# Patient Record
Sex: Male | Born: 1965
Health system: Southern US, Community
[De-identification: ages and names within clinical notes are randomized; demographics above are authoritative.]

## PROBLEM LIST (undated history)

## (undated) DIAGNOSIS — E785 Hyperlipidemia, unspecified: Secondary | ICD-10-CM

## (undated) DIAGNOSIS — D649 Anemia, unspecified: Secondary | ICD-10-CM

## (undated) DIAGNOSIS — K5010 Crohn's disease of large intestine without complications: Secondary | ICD-10-CM

## (undated) HISTORY — DX: Hyperlipidemia, unspecified: E78.5

## (undated) HISTORY — PX: COLOSTOMY: SHX63

## (undated) HISTORY — DX: Anemia, unspecified: D64.9

## (undated) HISTORY — PX: ILEOSTOMY: SHX1783

---

## 2005-06-26 ENCOUNTER — Emergency Department (HOSPITAL_COMMUNITY): Admission: EM | Admit: 2005-06-26 | Discharge: 2005-06-26 | Payer: Self-pay | Admitting: Family Medicine

## 2015-06-25 DIAGNOSIS — K5080 Crohn's disease of both small and large intestine without complications: Secondary | ICD-10-CM | POA: Diagnosis not present

## 2015-07-02 DIAGNOSIS — K529 Noninfective gastroenteritis and colitis, unspecified: Secondary | ICD-10-CM | POA: Diagnosis not present

## 2015-07-02 DIAGNOSIS — I10 Essential (primary) hypertension: Secondary | ICD-10-CM | POA: Diagnosis not present

## 2015-07-02 DIAGNOSIS — R5382 Chronic fatigue, unspecified: Secondary | ICD-10-CM | POA: Diagnosis not present

## 2015-07-02 DIAGNOSIS — K5090 Crohn's disease, unspecified, without complications: Secondary | ICD-10-CM | POA: Diagnosis not present

## 2015-07-02 DIAGNOSIS — F17210 Nicotine dependence, cigarettes, uncomplicated: Secondary | ICD-10-CM | POA: Diagnosis not present

## 2015-07-02 DIAGNOSIS — E559 Vitamin D deficiency, unspecified: Secondary | ICD-10-CM | POA: Diagnosis not present

## 2015-07-13 DIAGNOSIS — J111 Influenza due to unidentified influenza virus with other respiratory manifestations: Secondary | ICD-10-CM | POA: Diagnosis not present

## 2015-08-23 DIAGNOSIS — K5080 Crohn's disease of both small and large intestine without complications: Secondary | ICD-10-CM | POA: Diagnosis not present

## 2015-09-02 DIAGNOSIS — K5090 Crohn's disease, unspecified, without complications: Secondary | ICD-10-CM | POA: Diagnosis not present

## 2015-09-02 DIAGNOSIS — K633 Ulcer of intestine: Secondary | ICD-10-CM | POA: Diagnosis not present

## 2015-09-02 DIAGNOSIS — K635 Polyp of colon: Secondary | ICD-10-CM | POA: Diagnosis not present

## 2015-09-02 DIAGNOSIS — K626 Ulcer of anus and rectum: Secondary | ICD-10-CM | POA: Diagnosis not present

## 2015-09-03 DIAGNOSIS — N183 Chronic kidney disease, stage 3 (moderate): Secondary | ICD-10-CM | POA: Diagnosis not present

## 2015-09-03 DIAGNOSIS — D649 Anemia, unspecified: Secondary | ICD-10-CM | POA: Diagnosis not present

## 2015-09-03 DIAGNOSIS — Z23 Encounter for immunization: Secondary | ICD-10-CM | POA: Diagnosis not present

## 2015-09-03 DIAGNOSIS — I129 Hypertensive chronic kidney disease with stage 1 through stage 4 chronic kidney disease, or unspecified chronic kidney disease: Secondary | ICD-10-CM | POA: Diagnosis not present

## 2015-09-03 DIAGNOSIS — Z9114 Patient's other noncompliance with medication regimen: Secondary | ICD-10-CM | POA: Diagnosis not present

## 2015-09-03 DIAGNOSIS — K5090 Crohn's disease, unspecified, without complications: Secondary | ICD-10-CM | POA: Diagnosis not present

## 2015-09-03 DIAGNOSIS — E559 Vitamin D deficiency, unspecified: Secondary | ICD-10-CM | POA: Diagnosis not present

## 2015-09-03 DIAGNOSIS — Z720 Tobacco use: Secondary | ICD-10-CM | POA: Diagnosis not present

## 2015-09-03 DIAGNOSIS — R0789 Other chest pain: Secondary | ICD-10-CM | POA: Diagnosis not present

## 2015-10-25 DIAGNOSIS — K529 Noninfective gastroenteritis and colitis, unspecified: Secondary | ICD-10-CM | POA: Diagnosis not present

## 2015-10-25 DIAGNOSIS — H5210 Myopia, unspecified eye: Secondary | ICD-10-CM | POA: Diagnosis not present

## 2015-10-25 DIAGNOSIS — E559 Vitamin D deficiency, unspecified: Secondary | ICD-10-CM | POA: Diagnosis not present

## 2015-10-25 DIAGNOSIS — K5090 Crohn's disease, unspecified, without complications: Secondary | ICD-10-CM | POA: Diagnosis not present

## 2015-10-25 DIAGNOSIS — Q142 Congenital malformation of optic disc: Secondary | ICD-10-CM | POA: Diagnosis not present

## 2015-10-25 DIAGNOSIS — H524 Presbyopia: Secondary | ICD-10-CM | POA: Diagnosis not present

## 2015-10-25 DIAGNOSIS — R197 Diarrhea, unspecified: Secondary | ICD-10-CM | POA: Diagnosis not present

## 2015-10-25 DIAGNOSIS — H16149 Punctate keratitis, unspecified eye: Secondary | ICD-10-CM | POA: Diagnosis not present

## 2015-10-25 DIAGNOSIS — F17210 Nicotine dependence, cigarettes, uncomplicated: Secondary | ICD-10-CM | POA: Diagnosis not present

## 2015-10-25 DIAGNOSIS — Z23 Encounter for immunization: Secondary | ICD-10-CM | POA: Diagnosis not present

## 2015-10-25 DIAGNOSIS — H5213 Myopia, bilateral: Secondary | ICD-10-CM | POA: Diagnosis not present

## 2015-10-25 DIAGNOSIS — H04129 Dry eye syndrome of unspecified lacrimal gland: Secondary | ICD-10-CM | POA: Diagnosis not present

## 2015-10-25 DIAGNOSIS — K50018 Crohn's disease of small intestine with other complication: Secondary | ICD-10-CM | POA: Diagnosis not present

## 2016-01-03 DIAGNOSIS — K50018 Crohn's disease of small intestine with other complication: Secondary | ICD-10-CM | POA: Diagnosis not present

## 2016-01-03 DIAGNOSIS — K5090 Crohn's disease, unspecified, without complications: Secondary | ICD-10-CM | POA: Diagnosis not present

## 2016-01-11 DIAGNOSIS — K5090 Crohn's disease, unspecified, without complications: Secondary | ICD-10-CM | POA: Diagnosis not present

## 2016-03-21 ENCOUNTER — Encounter (HOSPITAL_COMMUNITY): Payer: Self-pay | Admitting: Emergency Medicine

## 2016-03-21 ENCOUNTER — Encounter: Payer: Self-pay | Admitting: Physician Assistant

## 2016-03-21 ENCOUNTER — Emergency Department (HOSPITAL_COMMUNITY)
Admission: EM | Admit: 2016-03-21 | Discharge: 2016-03-21 | Disposition: A | Discharge status: Home / Self Care | Payer: Self-pay | Attending: Emergency Medicine | Admitting: Emergency Medicine

## 2016-03-21 DIAGNOSIS — L308 Other specified dermatitis: Secondary | ICD-10-CM | POA: Insufficient documentation

## 2016-03-21 DIAGNOSIS — F17210 Nicotine dependence, cigarettes, uncomplicated: Secondary | ICD-10-CM | POA: Insufficient documentation

## 2016-03-21 DIAGNOSIS — L309 Dermatitis, unspecified: Secondary | ICD-10-CM | POA: Diagnosis not present

## 2016-03-21 HISTORY — DX: Crohn's disease of large intestine without complications: K50.10

## 2016-03-21 MED ORDER — CEPHALEXIN 500 MG PO CAPS: 500.0000 mg | ORAL_CAPSULE | Freq: Two times a day (BID) | ORAL | 0 refills | Status: AC

## 2016-03-21 MED ORDER — HYDROXYZINE HCL 10 MG PO TABS: 10.0000 mg | ORAL_TABLET | Freq: Four times a day (QID) | ORAL | 0 refills | Status: DC | PRN

## 2016-03-21 MED ORDER — CEPHALEXIN 250 MG PO CAPS
500.0000 mg | ORAL_CAPSULE | Freq: Once | ORAL | Status: AC
  Administered 2016-03-21: 500 mg via ORAL
  Filled 2016-03-21: qty 2

## 2016-03-21 MED ORDER — TRIAMCINOLONE ACETONIDE 0.1 % EX CREA: 1.0000 "application " | TOPICAL_CREAM | Freq: Two times a day (BID) | CUTANEOUS | 0 refills | Status: DC

## 2016-03-21 NOTE — ED Triage Notes (Signed)
Pt states he is here for rash that started 3 weeks ago and has spread to hands and feet, groins. Pt states it itches and has been oozing. Pt states he needs Remecaid medication refill.

## 2016-03-21 NOTE — ED Notes (Signed)
[facility] COUPON 183 GIVEN

## 2016-03-21 NOTE — ED Provider Notes (Signed)
[place] DEPT Provider Note   CSN: [id number] Arrival date & time: 03/21/16  9678  By signing my name below, I, Sonum Patel, attest that this documentation has been prepared under the direction and in the presence of Harlene Ramus, [place]. Electronically Signed: Sonum Patel, Education administrator. 03/21/16. 12:35 PM.  History   Chief Complaint Chief Complaint  Patient presents with  . Rash    The history is provided by the patient. No language interpreter was used.     HPI Comments: Julian Garner is a 50 y.o. male who presents to the Emergency Department complaining of 1 month of a constant pruritic rash to the tops of bilateral feet, hands, and face that initially was resolving but worsened again 1 week ago. He was using OTC antibiotic cream and hydrocortisone cream initially with relief but it is no longer helping. He reports associated scaly skin and clear drainage. He denies fever, sore throat, cough, SOB, CP, abdominal pain, vomiting. Denies use of any new soaps, lotions, detergents, medications or foods.   He also reports a history of Crohn's disease and receives Remicade infusions every 6-8 weeks. He states his last infusion was 1-2 months ago. He states has recently moved to [place] from [place] and does not have follow up with GI currently. He denies fever, nausea, vomiting, blood in stool, abdominal pain.   Past Medical History:  Diagnosis Date  . Crohn's colitis ([place])     There are no active problems to display for this patient.   Past Surgical History:  Procedure Laterality Date  . ILEOSTOMY         Home Medications    Prior to Admission medications   Medication Sig Start Date End Date Taking? Authorizing Provider  cephALEXin (KEFLEX) 500 MG capsule Take 1 capsule (500 mg total) by mouth 2 (two) times daily. 03/21/16 03/28/16  Nona Dell, PA-C  hydrOXYzine (ATARAX/VISTARIL) 10 MG tablet Take 1 tablet (10 mg total) by mouth every 6 (six) hours as needed  for itching. 03/21/16   Nona Dell, PA-C  triamcinolone cream (KENALOG) 0.1 % Apply 1 application topically 2 (two) times daily. 03/21/16   Nona Dell, PA-C    Family History History reviewed. No pertinent family history.  Social History Social History  Substance Use Topics  . Smoking status: Current Every Day Smoker    Packs/day: 0.50    Types: Cigarettes  . Smokeless tobacco: Never Used  . Alcohol use Yes     Allergies   Review of patient's allergies indicates no known allergies.   Review of Systems Review of Systems  Constitutional: Negative for fever.  Gastrointestinal: Negative for abdominal pain, blood in stool and vomiting.  Skin: Positive for rash.     Physical Exam Updated Vital Signs BP 158/85   Pulse 70   Temp 98.7 F (37.1 C) (Oral)   Resp 16   SpO2 100%   Physical Exam  Constitutional: He is oriented to person, place, and time. He appears well-developed and well-nourished. No distress.  HENT:  Head: Normocephalic and atraumatic.  Mouth/Throat: Uvula is midline, oropharynx is clear and moist and mucous membranes are normal. No oral lesions. No oropharyngeal exudate, posterior oropharyngeal edema, posterior oropharyngeal erythema or tonsillar abscesses.  No oral lesions.   Eyes: Conjunctivae and EOM are normal. Right eye exhibits no discharge. Left eye exhibits no discharge. No scleral icterus.  Neck: Normal range of motion. Neck supple.  Cardiovascular: Normal rate, regular rhythm, normal heart sounds and intact distal  pulses.   Pulmonary/Chest: Effort normal and breath sounds normal. No respiratory distress. He has no wheezes. He has no rales. He exhibits no tenderness.  Abdominal: Soft. Bowel sounds are normal. He exhibits no distension and no mass. There is no tenderness. There is no rebound and no guarding. No hernia.  Musculoskeletal: Normal range of motion. He exhibits no edema.  Neurological: He is alert and oriented to  person, place, and time.  Skin: Skin is warm and dry. He is not diaphoretic.  Diffuse dry skin noted throughout with small areas of erythematous papules and excoriations noted to bilateral hands and forearms. Large area of diffuse dry skin noted to dorsum of bilateral feet, left worse than right. Left foot with few small areas weeping serous fluid which appear to have been scratched. No surrounding swelling, erythema, or warmth. No lesions on palms or soles. No vesicles, pustules, or bulla.   Nursing note and vitals reviewed.    ED Treatments / Results  DIAGNOSTIC STUDIES: Oxygen Saturation is 100% on RA, normal by my interpretation.    COORDINATION OF CARE: 12:41 PM Discussed treatment plan with pt at bedside and pt agreed to plan.    Labs (all labs ordered are listed, but only abnormal results are displayed) Labs Reviewed - No data to display  EKG  EKG Interpretation None       Radiology No results found.  Procedures Procedures (including critical care time)  Medications Ordered in ED Medications  cephALEXin (KEFLEX) capsule 500 mg (500 mg Oral Given 03/21/16 1301)     Initial Impression / Assessment and Plan / ED Course  I have reviewed the triage vital signs and the nursing notes.  Pertinent labs & imaging results that were available during my care of the patient were reviewed by me and considered in my medical decision making (see chart for details).  Clinical Course     Rash consistent with eczema. Patient denies any difficulty breathing or swallowing.  Pt has a patent airway without stridor and is handling secretions without difficulty; no angioedema. No blisters, no pustules, no warmth, no draining sinus tracts, no superficial abscesses, no bullous impetigo, no vesicles, no desquamation, no target lesions with dusky purpura or a central bulla. Not tender to touch. Due to small amount of serous drainage noted to rash on left foot will tx with antibiotics for  possible secondary infection. No concern for SJS, TEN, TSS, tick borne illness, syphilis or other life-threatening condition. Will discharge home with keflex, kenalog ointment and vistaril for itching. Patient given information to call to schedule an appointment with gastroenterology to establish care for further management of his Crohn's. Discussed return precautions with patient. Advised patient to follow up with PCP at his rash has not improved over the next 1-2 weeks.     Final Clinical Impressions(s) / ED Diagnoses   Final diagnoses:  Other eczema    New Prescriptions Discharge Medication List as of 03/21/2016 12:57 PM    START taking these medications   Details  cephALEXin (KEFLEX) 500 MG capsule Take 1 capsule (500 mg total) by mouth 2 (two) times daily., Starting Tue 03/21/2016, Until Tue 03/28/2016, Print    triamcinolone cream (KENALOG) 0.1 % Apply 1 application topically 2 (two) times daily., Starting Tue 03/21/2016, Print       I personally performed the services described in this documentation, which was scribed in my presence. The recorded information has been reviewed and is accurate.    Chesley Noon [place], [place] 03/21/16  [place], MD 03/23/16 [phone]

## 2016-03-21 NOTE — ED Notes (Signed)
Pt states he is relocating to [place]. Has hx of Crohn's disease. States he needs to get set up for his Remacaid treatments. Here to for rash to arms and feet.

## 2016-03-21 NOTE — Discharge Instructions (Signed)
Take your antibiotic as prescribed until completed. I recommend applying the steroid cream 1-2 times daily for the next 2-4 weeks until your rash has improved. He should also use an emollient such as Eucerin which can be applied before after applying the topical steroids. Please follow up with a primary care provider from the Resource Guide provided below in one week if your rash has not improved. I also recommend calling the gastroenterology clinic listed below to set up an appointment to establish care for further management of your Crohn's. Please return to the Emergency Department if symptoms worsen or new onset of fever, abdominal pain, vomiting, blood in emesis or stool, redness, swelling, warmth, drainage, new/worsening rash.

## 2016-03-30 ENCOUNTER — Ambulatory Visit: Payer: Self-pay | Admitting: Physician Assistant

## 2016-04-11 ENCOUNTER — Ambulatory Visit: Payer: Self-pay | Admitting: Physician Assistant

## 2016-04-19 ENCOUNTER — Other Ambulatory Visit (INDEPENDENT_AMBULATORY_CARE_PROVIDER_SITE_OTHER): Payer: Medicare Other

## 2016-04-19 ENCOUNTER — Encounter: Payer: Self-pay | Admitting: Physician Assistant

## 2016-04-19 ENCOUNTER — Ambulatory Visit (INDEPENDENT_AMBULATORY_CARE_PROVIDER_SITE_OTHER): Payer: Medicare Other | Admitting: Physician Assistant

## 2016-04-19 VITALS — BP 132/66 | HR 104 | Ht 72.5 in | Wt 192.0 lb

## 2016-04-19 DIAGNOSIS — K50919 Crohn's disease, unspecified, with unspecified complications: Secondary | ICD-10-CM | POA: Diagnosis not present

## 2016-04-19 DIAGNOSIS — E538 Deficiency of other specified B group vitamins: Secondary | ICD-10-CM

## 2016-04-19 LAB — CBC WITH DIFFERENTIAL/PLATELET
BASOS PCT: 0.2 % (ref 0.0–3.0)
Basophils Absolute: 0 10*3/uL (ref 0.0–0.1)
EOS ABS: 0 10*3/uL (ref 0.0–0.7)
EOS PCT: 0.6 % (ref 0.0–5.0)
HEMATOCRIT: 34.6 % — AB (ref 39.0–52.0)
HEMOGLOBIN: 11.8 g/dL — AB (ref 13.0–17.0)
LYMPHS PCT: 19 % (ref 12.0–46.0)
Lymphs Abs: 1.1 10*3/uL (ref 0.7–4.0)
MCHC: 34 g/dL (ref 30.0–36.0)
MCV: 100.1 fl — ABNORMAL HIGH (ref 78.0–100.0)
Monocytes Absolute: 0.5 10*3/uL (ref 0.1–1.0)
Monocytes Relative: 9.1 % (ref 3.0–12.0)
Neutro Abs: 4.2 10*3/uL (ref 1.4–7.7)
Neutrophils Relative %: 71.1 % (ref 43.0–77.0)
Platelets: 341 10*3/uL (ref 150.0–400.0)
RBC: 3.46 Mil/uL — AB (ref 4.22–5.81)
RDW: 16 % — AB (ref 11.5–15.5)
WBC: 5.9 10*3/uL (ref 4.0–10.5)

## 2016-04-19 LAB — COMPREHENSIVE METABOLIC PANEL
ALBUMIN: 4.1 g/dL (ref 3.5–5.2)
ALK PHOS: 69 U/L (ref 39–117)
ALT: 14 U/L (ref 0–53)
AST: 21 U/L (ref 0–37)
BILIRUBIN TOTAL: 0.4 mg/dL (ref 0.2–1.2)
BUN: 16 mg/dL (ref 6–23)
CALCIUM: 9.5 mg/dL (ref 8.4–10.5)
CHLORIDE: 108 meq/L (ref 96–112)
CO2: 22 mEq/L (ref 19–32)
CREATININE: 1.78 mg/dL — AB (ref 0.40–1.50)
GFR: 52.64 mL/min — ABNORMAL LOW (ref >60.00)
Glucose, Bld: 82 mg/dL (ref 70–99)
Potassium: 4.1 mEq/L (ref 3.5–5.1)
Sodium: 138 mEq/L (ref 135–145)
TOTAL PROTEIN: 8.2 g/dL (ref 6.0–8.3)

## 2016-04-19 LAB — IBC PANEL
Iron: 42 ug/dL (ref 42–165)
SATURATION RATIOS: 11.6 % — AB (ref 20.0–50.0)
Transferrin: 258 mg/dL (ref 212.0–360.0)

## 2016-04-19 LAB — VITAMIN B12: Vitamin B-12: 79 pg/mL — ABNORMAL LOW (ref 211–911)

## 2016-04-19 LAB — FOLATE: Folate: 11.9 ng/mL (ref >5.9)

## 2016-04-19 LAB — C-REACTIVE PROTEIN: CRP: 0.5 mg/dL (ref 0.5–20.0)

## 2016-04-19 LAB — SEDIMENTATION RATE: SED RATE: 80 mm/h — AB (ref 0–15)

## 2016-04-19 NOTE — Patient Instructions (Addendum)
Please go to the basement level to have your labs drawn.   Dr. Corena Pilgrim nurse will contact you with a date for your Remicade infusion and B12 injection.

## 2016-04-19 NOTE — Progress Notes (Addendum)
Subjective:    Patient ID: Julian Garner, male    DOB: 06/30/1965, 50 y.o.   MRN: [id number]  HPI Julian Garner is a 50 year old African-American male, new to GI today referred by the [facility] after ER visit on 03/21/2016 which time he was seen for rash felt consistent with eczema. Agent reports long history of Crohn's disease dating back 15-20 years. He says he initially received GI care in [place], then [place] and has recently relocated to [place]. He has had several surgeries for what he describes as bowel obstructions and had a temporary  ileostomy as well as temporary: Ostomy in the past. He does not believe that he has had fistulous disease and has not had any perirectal disease. He has been maintained on Remicade over the past few years and says he's been receiving infusions every 6-8 weeks. He generally does very well and doesn't have any symptoms until he gets close to the time for his next infusion. He says when he has symptoms he generally gets right greater than left-sided lower abdominal discomfort. He has no current complaints of significant abdominal pain, no diarrhea or rectal bleeding. He is overdue for Remicade infusion, last received Remicade in September 2017. He states he is also B12 deficient and had been receiving a B12 injection with each Remicade infusion. He has  not been on any other therapy for Crohn's over the past 3-4 years. We do not have his records from GI physician in [place], he did provide that information today and records will be obtained.  Review of Systems Pertinent positive and negative review of systems were noted in the above HPI section.  All other review of systems was otherwise negative.  Outpatient Encounter Prescriptions as of 04/19/2016  Medication Sig  . hydrOXYzine (ATARAX/VISTARIL) 10 MG tablet Take 1 tablet (10 mg total) by mouth every 6 (six) hours as needed for itching.  . triamcinolone cream (KENALOG) 0.1 % Apply 1  application topically 2 (two) times daily.   No facility-administered encounter medications on file as of 04/19/2016.    No Known Allergies There are no active problems to display for this patient.  Social History   Social History  . Marital status: Single    Spouse name: N/A  . Number of children: 5  . Years of education: N/A   Occupational History  . disabled    Social History Main Topics  . Smoking status: Current Every Day Smoker    Packs/day: 0.50    Types: Cigarettes  . Smokeless tobacco: Never Used  . Alcohol use 1.8 - 2.4 oz/week    3 - 4 Cans of beer per week     Comment: daily  . Drug use:     Types: Marijuana  . Sexual activity: Not on file   Other Topics Concern  . Not on file   Social History Narrative  . No narrative on file    Mr. Julian Garner family history includes Diabetes in his father; Healthy in his mother; Hypertension in his maternal grandmother.      Objective:    Vitals:   04/19/16 1056  BP: 132/66  Pulse: (!) 104    Physical Exam upped African-American male in no acute distress, blood pressure 132/66 pulse 104, Height 6 foot, weight 192, BMI 25.6. HEENT ;nontraumatic normocephalic EOMI PERRLA sclera anicteric, Cardiovascular; regular rate and rhythm with S1-S2 no murmur or gallop, Pulmonary ;clear bilaterally, Abdomen; soft, is some mild tenderness in the right mid  right lower quadrant is no guarding or rebound no palpable mass or hepatosplenomegaly, he has a midline incisional scar in previous ileostomy and colostomy scars. Rectal ;exam not done, Ext;no clubbing cyanosis or edema skin warm and dry, Neuropsych ;mood and affect appropriate       Assessment & Plan:   #64 50 year old African-American male with long-standing Crohn's disease, establishing GI care here today after relocating from [place]. Patient has been on Remicade over the past few years with good control of his disease by his report. #2 several prior surgery secondary to  Crohn's elated obstruction with previous temporary ileostomy and col ostomy. #3 B12 deficiency  Plan; Obtain records from patient's gastroenterologist in [place] CBC, CMET, sedimentation rate CRP and B12 level. We'll also obtain QuantiFERON Gold Will initiate approval process for Remicade 5 mg/kg every 8 weeks Will also plan to continue B12 injections 1000 milligrams IM with each Remicade infusion. Patient will be established with Dr. Loletha Carrow Once records have been obtained and reviewed can decide indication for follow-up colonoscopy and/or changes to the above regimen.  Addendum;  The records received from [facility]. Records show patient with history of ileocolonic stricturing Crohn's disease, initially diagnosed 2008. He has had multiple small bowel resections and had an ileostomy December 2012 with reversal in 2014. Last colonoscopy done in April 2017 with fair prep showing multiple localized aphthous ulcers in the neoterminal ileum, there were a few superficial stellate ulcers in the rectum, ileocolonic anastomosis appeared normal. path report  notes indicate pathology showed hyperplastic changes in the small bowel mucosa, no dysplasia or malignancy., Rectal mucosa with hyperplastic change biopsies negative transverse colon biopsies negative He also had colonoscopy in March 2016 with active ileitis proctitis and colitis. At that time it was recommended to increase his Remicade to 8 mg/kg. Records also relate he had been on Lialda 4.8 g daily. Records will be scanned.   Danasha Melman S Jakel Alphin PA-C 04/19/2016   Cc: No ref. provider found   Thank you for sending this case to me. I have reviewed the entire note, and the outlined plan seems appropriate.  I will review his records as well. Given the last colonoscopy findings with active disease in the neo-TI, it may be that this dose of Remicade is insufficient, he needs concomitant immune suppressant or different  class of biologic.  05/05/16;   Infliximab antibodies negative and infliximab trough level = 0.6, which is subtherapeutic. However, he was overdue for infusion, lowering trough level Will increase next infusion to 10 mg/kg because persistent symptoms between infusions and active disease seen on last colonoscopy (from records)

## 2016-04-20 ENCOUNTER — Other Ambulatory Visit: Payer: Self-pay

## 2016-04-20 ENCOUNTER — Telehealth: Payer: Self-pay

## 2016-04-20 DIAGNOSIS — K50919 Crohn's disease, unspecified, with unspecified complications: Secondary | ICD-10-CM

## 2016-04-20 MED ORDER — CYANOCOBALAMIN 1000 MCG/ML IJ SOLN: 1000.0000 ug | Freq: Once | INTRAMUSCULAR | Status: DC

## 2016-04-20 MED ORDER — SODIUM CHLORIDE 0.9 % IV SOLN: 5.0000 mg/kg | Freq: Once | INTRAVENOUS | Status: DC

## 2016-04-20 MED ORDER — INFLIXIMAB 100 MG IV SOLR: 5.0000 mg/kg | INTRAVENOUS | 3 refills | Status: DC

## 2016-04-20 MED ORDER — DIPHENHYDRAMINE HCL 25 MG PO TABS: 50.0000 mg | ORAL_TABLET | Freq: Once | ORAL | Status: DC

## 2016-04-20 MED ORDER — ACETAMINOPHEN 325 MG PO TABS: 650.0000 mg | ORAL_TABLET | Freq: Once | ORAL | Status: DC

## 2016-04-20 NOTE — Telephone Encounter (Signed)
-----   Message from Alfredia Ferguson, PA-C sent at 04/19/2016  1:30 PM EST ----- Regarding: remicade Greg[place] Signs- this is a new pt establishing with [place] today- will be Dr Loletha Carrow pt. He has been on Remicade - just moved form [place]- please initiate approval process for continuation of Remicade 5 mg/kg q 8 weeks - and go ahead and schedule infusion as soon as possible as he is overdue,  Also send order for B12 1000 mg Im to be given  when he goes for each infusion

## 2016-04-20 NOTE — Telephone Encounter (Signed)
Left vm for patient to call re: remicade infusion date/time. Patient is scheduled at [facility], 04/26/16 at 9:00 am. [facility] short stay did not have available appointment until 05/10/16.  According to [place] patient do not require prior authorization.

## 2016-04-20 NOTE — Telephone Encounter (Signed)
Spoke to patient, he is aware of Remicade infusion.

## 2016-04-21 ENCOUNTER — Other Ambulatory Visit: Payer: Self-pay

## 2016-04-21 ENCOUNTER — Telehealth: Payer: Self-pay | Admitting: Gastroenterology

## 2016-04-21 DIAGNOSIS — K50919 Crohn's disease, unspecified, with unspecified complications: Secondary | ICD-10-CM

## 2016-04-21 LAB — QUANTIFERON TB GOLD ASSAY (BLOOD)
INTERFERON GAMMA RELEASE ASSAY: NEGATIVE
MITOGEN-NIL SO: 6.41 [IU]/mL
Quantiferon Nil Value: 1.15 IU/mL

## 2016-04-21 MED ORDER — CYANOCOBALAMIN 1000 MCG/ML IJ SOLN: 1000.0000 ug | INTRAMUSCULAR | 0 refills | Status: AC

## 2016-04-21 NOTE — Telephone Encounter (Signed)
Spoke to patient, he will come get infliximab level drawn on 12/5 between 10:00-12:00 at our lab. His infusion is on 12/6 at 9:00. Patient will also get his B-12 injection on that day too. Will schedule patient to get weekly B-12 injections for 3 weeks following then will go to monthly injections.

## 2016-04-21 NOTE — Telephone Encounter (Signed)
I reviewed the patient's outside records.  I have changed my mind on the imfliximab (remicade) dosing for him.  Please keep it at his current dose of 46m/kg for the upcoming infusion, but I need an infliximab level drawn at our office lab the day before or early the AM of his infusion. The level must be drawn within 24 hours BEFORE the infusion.  I will determine the dose of future infusions based partially on that lab result.

## 2016-04-21 NOTE — Progress Notes (Signed)
Letter mailed

## 2016-04-24 ENCOUNTER — Telehealth: Payer: Self-pay | Admitting: *Deleted

## 2016-04-24 NOTE — Telephone Encounter (Signed)
Received records on 04-19-2016.  Stamped all pages with date, MR # and Provider's name on 04-24-2016.  Sent to be scanned.

## 2016-04-25 ENCOUNTER — Telehealth: Payer: Self-pay | Admitting: Physician Assistant

## 2016-04-25 ENCOUNTER — Other Ambulatory Visit: Payer: Self-pay

## 2016-04-25 NOTE — Telephone Encounter (Signed)
Spoke to [place] in short stay at [facility], also faxed order to draw infliximab level prior to giving infusion. Patient had difficulty getting a ride today to come get his labs done, Dr. Loletha Carrow okay'd it to be done tomorrow. Noticed that in epic, order is still there for the remicade 5 mg/kg along with remicade 34m/kg. Cannot delete order. Updated dosage is remicade 825mkg per Dr. DaLoletha Carrow

## 2016-04-25 NOTE — Telephone Encounter (Signed)
Spoke to patient, he is having a difficult time finding transportation to come in for his lab work today. He is to have infliximab level drawn today prior to his infusion tomorrow. If he cannot get this done, do you want me to order this drawn prior to the infusion tomorrow? Please advise.

## 2016-04-25 NOTE — Telephone Encounter (Signed)
patient states that he was told to come in for labs anytime between 10-12 today. He states that his transportation has not arrived as of yet and is not sure what time they will arrive. He is wanting to know if it will be ok to come in later this afternoon or tomorrow morning.

## 2016-04-25 NOTE — Telephone Encounter (Signed)
Yes, it can be done tomorrow, but please communicate with infusion center so they know what to draw and how to send it.  It a specialty lab that they may not be used to drawing.   I want the infliximab level, I do not need the antibody level.

## 2016-04-26 ENCOUNTER — Ambulatory Visit (HOSPITAL_COMMUNITY)
Admission: RE | Admit: 2016-04-26 | Discharge: 2016-04-26 | Disposition: A | Discharge status: Home / Self Care | Payer: Medicare Other | Source: Ambulatory Visit | Attending: Gastroenterology | Admitting: Gastroenterology

## 2016-04-26 DIAGNOSIS — K50919 Crohn's disease, unspecified, with unspecified complications: Secondary | ICD-10-CM | POA: Diagnosis not present

## 2016-04-26 MED ORDER — DIPHENHYDRAMINE HCL 25 MG PO CAPS
ORAL_CAPSULE | ORAL | Status: AC
  Administered 2016-04-26: 50 mg via ORAL
  Filled 2016-04-26: qty 2

## 2016-04-26 MED ORDER — ACETAMINOPHEN 325 MG PO TABS
650.0000 mg | ORAL_TABLET | Freq: Once | ORAL | Status: AC
  Administered 2016-04-26: 650 mg via ORAL

## 2016-04-26 MED ORDER — CYANOCOBALAMIN 1000 MCG/ML IJ SOLN
INTRAMUSCULAR | Status: AC
  Administered 2016-04-26: 1000 ug
  Filled 2016-04-26: qty 1

## 2016-04-26 MED ORDER — CYANOCOBALAMIN 1000 MCG/ML IJ SOLN: 1000.0000 ug | Freq: Once | INTRAMUSCULAR | Status: DC

## 2016-04-26 MED ORDER — DIPHENHYDRAMINE HCL 25 MG PO CAPS
50.0000 mg | ORAL_CAPSULE | Freq: Once | ORAL | Status: AC
  Administered 2016-04-26: 50 mg via ORAL

## 2016-04-26 MED ORDER — ACETAMINOPHEN 325 MG PO TABS
ORAL_TABLET | ORAL | Status: AC
  Administered 2016-04-26: 650 mg via ORAL
  Filled 2016-04-26: qty 2

## 2016-04-26 MED ORDER — SODIUM CHLORIDE 0.9 % IV SOLN
8.0000 mg/kg | Freq: Once | INTRAVENOUS | Status: AC
  Administered 2016-04-26: 700 mg via INTRAVENOUS
  Filled 2016-04-26: qty 70

## 2016-05-04 ENCOUNTER — Encounter: Payer: Medicare Other | Admitting: Gastroenterology

## 2016-05-04 ENCOUNTER — Encounter: Payer: Self-pay | Admitting: Gastroenterology

## 2016-05-04 NOTE — Progress Notes (Signed)
Patient came today for his 2nd b12 of 4 weekly injections. His first injection was given at Short Stay on the same day he had a Remicade infusion. Upon bringing patient into the room, it was found that he did not bring the b12 medication that was sent to his pharmacy as he states he was not told to get any medicine, although there is documentation otherwise (see 04/21/16 documentation). I advised patient that unfortunately, we do not have the medication here and that he would have to get the medication that is already at the pharmacy so we can give injections. He became more agitated and began walking out of the room. I asked if I could reschedule his injections and he told me "no."    I have left his upcoming appointments scheduled for now in the case that he changes his mind.  I will cc Dr Loletha Carrow to advise.

## 2016-05-05 ENCOUNTER — Telehealth: Payer: Self-pay

## 2016-05-05 NOTE — Progress Notes (Signed)
Please call him to tell him the B12 is at his pharmacy and it is time for his injection.  He needs it weekly x 4 and then monthly. If he would prefer to get it at his primary care clinic because they perhaps have it on hand, fine with me.  If he does not want it at all, that is his choice.  He needs to see me in the clinic in January so we can discuss his Crohn's and plan future Remicade infusions.   His trough infliximab level was low, so I will be increasing the dose of future infusions.    I have open office time 1/18 and 1/22.

## 2016-05-05 NOTE — Telephone Encounter (Signed)
Spoke to patient, he did receive the letter that states he needs to go to the pharmacy and pick up his B12 vial and bring it to his injection appointments. I also reminded him that we discussed this over the phone. Gave him Dr. Loletha Carrow' recommendations about either getting the injections here or at his PCP's office. He said that he would give himself the injections. I told him that I would keep his injection appointments in our system, incase he had a problem obtaining his B12.  He also understands that he needs a follow up appointment here in our office. Gave him the appointment information of 06/08/16 at 4:00.

## 2016-05-10 LAB — MISC LABCORP TEST (SEND OUT): Labcorp test code: 503770

## 2016-06-08 ENCOUNTER — Ambulatory Visit: Payer: Medicare Other | Admitting: Gastroenterology

## 2016-06-22 ENCOUNTER — Ambulatory Visit: Payer: Medicare Other | Admitting: Gastroenterology

## 2016-07-03 ENCOUNTER — Other Ambulatory Visit: Payer: Self-pay

## 2016-07-03 ENCOUNTER — Ambulatory Visit: Payer: Medicare Other | Admitting: Gastroenterology

## 2016-07-03 ENCOUNTER — Telehealth: Payer: Self-pay | Admitting: Gastroenterology

## 2016-07-03 NOTE — Telephone Encounter (Signed)
Pt is scheduled for 07-05-16

## 2016-07-03 NOTE — Telephone Encounter (Signed)
Patient did call answering service on 07/01/16 to cancel appointment.

## 2016-07-05 ENCOUNTER — Encounter: Payer: Self-pay | Admitting: Gastroenterology

## 2016-07-05 ENCOUNTER — Telehealth: Payer: Self-pay

## 2016-07-05 ENCOUNTER — Other Ambulatory Visit: Payer: Self-pay

## 2016-07-05 ENCOUNTER — Other Ambulatory Visit (INDEPENDENT_AMBULATORY_CARE_PROVIDER_SITE_OTHER): Payer: Medicare Other

## 2016-07-05 ENCOUNTER — Ambulatory Visit (INDEPENDENT_AMBULATORY_CARE_PROVIDER_SITE_OTHER): Payer: Medicare Other | Admitting: Gastroenterology

## 2016-07-05 VITALS — BP 130/70 | HR 84 | Ht 74.0 in | Wt 190.0 lb

## 2016-07-05 DIAGNOSIS — K5000 Crohn's disease of small intestine without complications: Secondary | ICD-10-CM

## 2016-07-05 DIAGNOSIS — R1031 Right lower quadrant pain: Secondary | ICD-10-CM

## 2016-07-05 DIAGNOSIS — R21 Rash and other nonspecific skin eruption: Secondary | ICD-10-CM

## 2016-07-05 DIAGNOSIS — R197 Diarrhea, unspecified: Secondary | ICD-10-CM | POA: Diagnosis not present

## 2016-07-05 DIAGNOSIS — R5382 Chronic fatigue, unspecified: Secondary | ICD-10-CM

## 2016-07-05 LAB — CBC WITH DIFFERENTIAL/PLATELET
Basophils Absolute: 0 10*3/uL (ref 0.0–0.1)
Basophils Relative: 0.8 % (ref 0.0–3.0)
EOS PCT: 1 % (ref 0.0–5.0)
Eosinophils Absolute: 0.1 10*3/uL (ref 0.0–0.7)
HCT: 32.7 % — ABNORMAL LOW (ref 39.0–52.0)
Hemoglobin: 11 g/dL — ABNORMAL LOW (ref 13.0–17.0)
LYMPHS ABS: 1.2 10*3/uL (ref 0.7–4.0)
Lymphocytes Relative: 21.9 % (ref 12.0–46.0)
MCHC: 33.5 g/dL (ref 30.0–36.0)
MCV: 102 fl — AB (ref 78.0–100.0)
MONOS PCT: 10.1 % (ref 3.0–12.0)
Monocytes Absolute: 0.5 10*3/uL (ref 0.1–1.0)
NEUTROS ABS: 3.5 10*3/uL (ref 1.4–7.7)
NEUTROS PCT: 66.2 % (ref 43.0–77.0)
PLATELETS: 265 10*3/uL (ref 150.0–400.0)
RBC: 3.21 Mil/uL — ABNORMAL LOW (ref 4.22–5.81)
RDW: 15.4 % (ref 11.5–15.5)
WBC: 5.4 10*3/uL (ref 4.0–10.5)

## 2016-07-05 LAB — COMPREHENSIVE METABOLIC PANEL
ALBUMIN: 4.1 g/dL (ref 3.5–5.2)
ALK PHOS: 70 U/L (ref 39–117)
ALT: 14 U/L (ref 0–53)
AST: 21 U/L (ref 0–37)
BUN: 16 mg/dL (ref 6–23)
CO2: 22 mEq/L (ref 19–32)
CREATININE: 1.61 mg/dL — AB (ref 0.40–1.50)
Calcium: 9.2 mg/dL (ref 8.4–10.5)
Chloride: 114 mEq/L — ABNORMAL HIGH (ref 96–112)
GFR: 59.05 mL/min — ABNORMAL LOW (ref >60.00)
Glucose, Bld: 86 mg/dL (ref 70–99)
Potassium: 4.3 mEq/L (ref 3.5–5.1)
SODIUM: 139 meq/L (ref 135–145)
Total Bilirubin: 0.4 mg/dL (ref 0.2–1.2)
Total Protein: 7.7 g/dL (ref 6.0–8.3)

## 2016-07-05 LAB — SEDIMENTATION RATE: SED RATE: 47 mm/h — AB (ref 0–15)

## 2016-07-05 MED ORDER — NA SULFATE-K SULFATE-MG SULF 17.5-3.13-1.6 GM/177ML PO SOLN: 1.0000 | Freq: Once | ORAL | 0 refills | Status: AC

## 2016-07-05 NOTE — Telephone Encounter (Signed)
Spoke to patient, he is scheduled at [facility] for next infusion of Remicade on 07/13/16 at 9:00 am. Orders in [place]. Patient also states that he is due for his Vit B12 injection, order placed as he is to receive this every time he has infusion.

## 2016-07-05 NOTE — Patient Instructions (Addendum)
If you are age 49 or older, your body mass index should be between 23-30. Your Body mass index is 24.39 kg/m. If this is out of the aforementioned range listed, please consider follow up with your Primary Care Provider.  If you are age 10 or younger, your body mass index should be between 19-25. Your Body mass index is 24.39 kg/m. If this is out of the aformentioned range listed, please consider follow up with your Primary Care Provider.   You have been scheduled for a colonoscopy. Please follow written instructions given to you at your visit today.  Please pick up your prep supplies at the pharmacy within the next 1-3 days. If you use inhalers (even only as needed), please bring them with you on the day of your procedure. Your physician has requested that you go to www.startemmi.com and enter the access code given to you at your visit today. This web site gives a general overview about your procedure. However, you should still follow specific instructions given to you by our office regarding your preparation for the procedure.  Please use over the counter Imodium as needed for diarrhea.  Your physician has requested that you go to the basement for lab work before leaving today.  We have you scheduled to see Dr Wilhemina Bonito at [place] at  230pm. Thank you for choosing [facility]  Dr Wilfrid Lund III

## 2016-07-05 NOTE — Progress Notes (Signed)
Julian Garner  Chief Complaint: Ileal Crohn's disease.  Subjective  History:  This is a 51 year old man here for clinic follow-up regarding his ileal Crohn's disease. He was seen for the first time by our PA on 04/19/2016. See that extensive Garner with its addenda of outside record review for details. Of Garner, it has been somewhat difficult to get Mr. Laforge in for follow-up since he has had several cancellations and rescheduling of appointments. He was given his Remicade infusion at a dose of 8 mg/kg first week of December 2017. He had not yet been scheduled for his next infusion because he had not followed up in clinic as requested. He has a multitude of symptoms today. He has ongoing right-sided abdominal pain, typically 5-6 semi-formed BMs per day without rectal bleeding. That has been his pattern for many years. Review of his records from his surgery reveal that his last colonoscopy in April 2017 had "multiple aphthous ulcers". There is no Rutgeert's score nor any photographs to know exactly how active his Crohn's was at that time. It appears that was the time his dose was increased from 5 mg to 8 mg/kg. He does not think there is any change in his digestive symptoms at that time. In addition, he is chronically fatigued, says he can sleep much of the time, feels dizzy in the morning, and has numbness in his fingers. Bothered by pain in both feet and says there is a rash and he thinks may be psoriasis.  ROS: Cardiovascular:  no chest pain Respiratory: no dyspnea Remainder of review of systems are negative except as noted above in the history. The patient's Past Medical, Family and Social History were reviewed and are on file in the EMR.  Objective:  Med list reviewed  Vital signs in last 24 hrs: Vitals:   07/05/16 0959  BP: 130/70  Pulse: 84    Physical Exam    HEENT: sclera anicteric, oral mucosa moist without lesions  Neck: supple, no thyromegaly, JVD or  lymphadenopathy  Cardiac: RRR without murmurs, S1S2 heard, no peripheral edema  Pulm: clear to auscultation bilaterally, normal RR and effort noted  Abdomen: soft, Right-sided tenderness, with active bowel sounds. No guarding or palpable hepatosplenomegaly. Multiple scars  Skin the skin over his shins and feet is very dry with some dark discoloration and scaling. He has multiple firm areas that seem like corns on both feet as well.  It does not look like pyoderma gangrenosum  Recent Labs:  CBC Latest Ref Rng & Units 07/05/2016 04/19/2016  WBC 4.0 - 10.5 K/uL 5.4 5.9  Hemoglobin 13.0 - 17.0 g/dL 11.0(L) 11.8(L)  Hematocrit 39.0 - 52.0 % 32.7(L) 34.6(L)  Platelets 150.0 - 400.0 K/uL 265.0 341.0   CMP Latest Ref Rng & Units 07/05/2016 04/19/2016  Glucose 70 - 99 mg/dL 86 82  BUN 6 - 23 mg/dL 16 16  Creatinine 0.40 - 1.50 mg/dL 1.61(H) 1.78(H)  Sodium 135 - 145 mEq/L 139 138  Potassium 3.5 - 5.1 mEq/L 4.3 4.1  Chloride 96 - 112 mEq/L 114(H) 108  CO2 19 - 32 mEq/L 22 22  Calcium 8.4 - 10.5 mg/dL 9.2 9.5  Total Protein 6.0 - 8.3 g/dL 7.7 8.2  Total Bilirubin 0.2 - 1.2 mg/dL 0.4 0.4  Alkaline Phos 39 - 117 U/L 70 69  AST 0 - 37 U/L 21 21  ALT 0 - 53 U/L 14 14     @ASSESSMENTPLANBEGIN @ Assessment: Encounter Diagnoses  Name Primary?  . Crohn's disease  of ileum without complication ([place]) Yes  . RLQ abdominal pain   . Diarrhea, unspecified type   . Chronic fatigue   . Rash and nonspecific skin eruption    This patient has ongoing symptoms with active inflammation and ulcers in in the neoterminal ileum on last colonoscopy done outside this institution 10 months ago. He also has a multitude of other symptoms that are of unclear relation to the Crohn's. He has B12 deficiency and gives himself injections at home.   Plan: It was a 45 minute visit today, with over half spent addressing his many issues and answering multiple questions. We'll schedule him for a Remicade infusion next  week at a dose of 10 mg/kg I have gotten updated CBC CMP today, ESR, and also a TPMT enzyme activity in case he needs immune modulator therapy. Colonoscopy in about 6 weeks. If he still has very active Crohn's, he will either need additional medicines were changed to a different biologic agent.  Dermatology referral for his skin complaints Nelida Meuse III

## 2016-07-12 ENCOUNTER — Other Ambulatory Visit (HOSPITAL_COMMUNITY): Payer: Self-pay | Admitting: *Deleted

## 2016-07-13 ENCOUNTER — Other Ambulatory Visit: Payer: Self-pay

## 2016-07-13 ENCOUNTER — Ambulatory Visit (HOSPITAL_COMMUNITY)
Admission: RE | Admit: 2016-07-13 | Discharge: 2016-07-13 | Disposition: A | Discharge status: Home / Self Care | Payer: Medicare Other | Source: Ambulatory Visit | Attending: Gastroenterology | Admitting: Gastroenterology

## 2016-07-13 DIAGNOSIS — K5000 Crohn's disease of small intestine without complications: Secondary | ICD-10-CM

## 2016-07-13 MED ORDER — SODIUM CHLORIDE 0.9 % IV SOLN
10.0000 mg/kg | Freq: Once | INTRAVENOUS | Status: AC
  Administered 2016-07-13: 900 mg via INTRAVENOUS
  Filled 2016-07-13: qty 90

## 2016-07-13 MED ORDER — ACETAMINOPHEN 325 MG PO TABS
650.0000 mg | ORAL_TABLET | Freq: Once | ORAL | Status: AC
  Administered 2016-07-13: 650 mg via ORAL

## 2016-07-13 MED ORDER — CYANOCOBALAMIN 1000 MCG/ML IJ SOLN
1000.0000 ug | Freq: Once | INTRAMUSCULAR | Status: AC
  Administered 2016-07-13: 1000 ug via INTRAMUSCULAR
  Filled 2016-07-13: qty 1

## 2016-07-13 MED ORDER — DIPHENHYDRAMINE HCL 25 MG PO CAPS
50.0000 mg | ORAL_CAPSULE | Freq: Once | ORAL | Status: AC
  Administered 2016-07-13: 09:00:00 50 mg via ORAL

## 2016-07-13 MED ORDER — SODIUM CHLORIDE 0.9 % IV SOLN
Freq: Once | INTRAVENOUS | Status: AC
  Administered 2016-07-13: 10:00:00 via INTRAVENOUS

## 2016-07-13 MED ORDER — SODIUM CHLORIDE 0.9 % IV SOLN: 10.0000 mg/kg | INTRAVENOUS | Status: DC

## 2016-07-13 MED ORDER — ACETAMINOPHEN 325 MG PO TABS
ORAL_TABLET | ORAL | Status: AC
  Filled 2016-07-13: qty 2

## 2016-07-13 MED ORDER — DIPHENHYDRAMINE HCL 25 MG PO CAPS
ORAL_CAPSULE | ORAL | Status: AC
  Filled 2016-07-13: qty 2

## 2016-07-14 LAB — THIOPURINE METHYLTRANSFERASE (TPMT), RBC: Thiopurine Methyltransferase, RBC: 17 nmol/hr/mL RBC

## 2016-08-02 DIAGNOSIS — B353 Tinea pedis: Secondary | ICD-10-CM | POA: Diagnosis not present

## 2016-08-02 DIAGNOSIS — L309 Dermatitis, unspecified: Secondary | ICD-10-CM | POA: Diagnosis not present

## 2016-08-17 ENCOUNTER — Encounter: Payer: Medicare Other | Admitting: Gastroenterology

## 2016-08-17 ENCOUNTER — Telehealth: Payer: Self-pay | Admitting: Gastroenterology

## 2016-08-17 NOTE — Telephone Encounter (Signed)
Yes, late cancellation fee. That spot is now wasted.

## 2016-08-31 NOTE — Telephone Encounter (Signed)
Unable to [employer] cancel fee due to his insurance

## 2016-09-07 ENCOUNTER — Other Ambulatory Visit: Payer: Self-pay

## 2016-09-07 DIAGNOSIS — E538 Deficiency of other specified B group vitamins: Secondary | ICD-10-CM

## 2016-09-07 DIAGNOSIS — K5000 Crohn's disease of small intestine without complications: Secondary | ICD-10-CM

## 2016-09-08 ENCOUNTER — Ambulatory Visit (HOSPITAL_COMMUNITY)
Admission: RE | Admit: 2016-09-08 | Discharge: 2016-09-08 | Disposition: A | Discharge status: Home / Self Care | Payer: Medicare Other | Source: Ambulatory Visit | Attending: Gastroenterology | Admitting: Gastroenterology

## 2016-09-08 DIAGNOSIS — K5000 Crohn's disease of small intestine without complications: Secondary | ICD-10-CM | POA: Insufficient documentation

## 2016-09-08 DIAGNOSIS — E538 Deficiency of other specified B group vitamins: Secondary | ICD-10-CM | POA: Insufficient documentation

## 2016-09-08 MED ORDER — CYANOCOBALAMIN 1000 MCG/ML IJ SOLN
1000.0000 ug | Freq: Once | INTRAMUSCULAR | Status: DC
Start: 2016-09-08 — End: 2016-09-09

## 2016-09-08 MED ORDER — DIPHENHYDRAMINE HCL 25 MG PO CAPS
ORAL_CAPSULE | ORAL | Status: AC
  Administered 2016-09-08: 50 mg
  Filled 2016-09-08: qty 2

## 2016-09-08 MED ORDER — ACETAMINOPHEN 325 MG PO TABS
650.0000 mg | ORAL_TABLET | Freq: Once | ORAL | Status: DC
Start: 2016-09-08 — End: 2016-09-09

## 2016-09-08 MED ORDER — SODIUM CHLORIDE 0.9 % IV SOLN
Freq: Once | INTRAVENOUS | Status: AC
  Administered 2016-09-08: 13:00:00 via INTRAVENOUS

## 2016-09-08 MED ORDER — CYANOCOBALAMIN 1000 MCG/ML IJ SOLN
INTRAMUSCULAR | Status: AC
  Administered 2016-09-08: 1000 ug via INTRAMUSCULAR
  Filled 2016-09-08: qty 1

## 2016-09-08 MED ORDER — SODIUM CHLORIDE 0.9 % IV SOLN
10.0000 mg/kg | Freq: Once | INTRAVENOUS | Status: AC
  Administered 2016-09-08: 13:00:00 900 mg via INTRAVENOUS
  Filled 2016-09-08: qty 90

## 2016-09-08 MED ORDER — DIPHENHYDRAMINE HCL 25 MG PO CAPS: 50.0000 mg | ORAL_CAPSULE | Freq: Once | ORAL | Status: DC

## 2016-09-08 MED ORDER — ACETAMINOPHEN 325 MG PO TABS
ORAL_TABLET | ORAL | Status: AC
  Administered 2016-09-08: 13:00:00 650 mg
  Filled 2016-09-08: qty 2

## 2016-09-25 ENCOUNTER — Encounter: Payer: Medicare Other | Admitting: Gastroenterology

## 2016-09-25 ENCOUNTER — Telehealth: Payer: Self-pay | Admitting: Gastroenterology

## 2016-09-25 NOTE — Telephone Encounter (Signed)
I am sorry to hear his son is sick.  HOWEVER, He was a late cancel for a 2/12 office visit. He was a no show for a 3/29 colonoscopy - wasted procedure slot. He is now a same-day cancel for a colonoscopy - wasted procedure slot.  I will allow one more reschedule for a colonoscopy.  If he does not show, or cancels with less than one week notice, I will discharge him from this practice.

## 2016-09-25 NOTE — Telephone Encounter (Signed)
Called patient had to lvm, wanted to let him know about Dr. Loletha Carrow' instructions as far as rescheduling, and if a late cancellation does occur what the result will be. Let him know that these appointment slots are very limited, I see where he has rescheduled to 5/16 and we will be glad to see him at that date/time.

## 2016-10-04 ENCOUNTER — Encounter: Payer: Self-pay | Admitting: Gastroenterology

## 2016-10-04 ENCOUNTER — Telehealth: Payer: Self-pay | Admitting: Gastroenterology

## 2016-10-04 ENCOUNTER — Ambulatory Visit (AMBULATORY_SURGERY_CENTER): Payer: Medicare Other | Admitting: Gastroenterology

## 2016-10-04 VITALS — BP 145/77 | HR 68 | Temp 97.8°F | Resp 20 | Ht 74.0 in | Wt 190.0 lb

## 2016-10-04 DIAGNOSIS — K5000 Crohn's disease of small intestine without complications: Secondary | ICD-10-CM | POA: Diagnosis not present

## 2016-10-04 DIAGNOSIS — K5090 Crohn's disease, unspecified, without complications: Secondary | ICD-10-CM | POA: Diagnosis not present

## 2016-10-04 MED ORDER — SODIUM CHLORIDE 0.9 % IV SOLN: 500.0000 mL | INTRAVENOUS | Status: DC

## 2016-10-04 NOTE — Patient Instructions (Signed)
YOU HAD AN ENDOSCOPIC PROCEDURE TODAY AT [facility]:   Refer to the procedure report that was given to you for any specific questions about what was found during the examination.  If the procedure report does not answer your questions, please call your gastroenterologist to clarify.  If you requested that your care partner not be given the details of your procedure findings, then the procedure report has been included in a sealed envelope for you to review at your convenience later.  YOU SHOULD EXPECT: Some feelings of bloating in the abdomen. Passage of more gas than usual.  Walking can help get rid of the air that was put into your GI tract during the procedure and reduce the bloating. If you had a lower endoscopy (such as a colonoscopy or flexible sigmoidoscopy) you may notice spotting of blood in your stool or on the toilet paper. If you underwent a bowel prep for your procedure, you may not have a normal bowel movement for a few days.  Please Note:  You might notice some irritation and congestion in your nose or some drainage.  This is from the oxygen used during your procedure.  There is no need for concern and it should clear up in a day or so.  SYMPTOMS TO REPORT IMMEDIATELY:   Following lower endoscopy (colonoscopy or flexible sigmoidoscopy):  Excessive amounts of blood in the stool  Significant tenderness or worsening of abdominal pains  Swelling of the abdomen that is new, acute  Fever of 100F or higher    For urgent or emergent issues, a gastroenterologist can be reached at any hour by calling [phone].   DIET:  We do recommend a small meal at first, but then you may proceed to your regular diet.  Drink plenty of fluids but you should avoid alcoholic beverages for 24 hours.  ACTIVITY:  You should plan to take it easy for the rest of today and you should NOT DRIVE or use heavy machinery until tomorrow (because of the sedation medicines used during the test).     FOLLOW UP: Our staff will call the number listed on your records the next business day following your procedure to check on you and address any questions or concerns that you may have regarding the information given to you following your procedure. If we do not reach you, we will leave a message.  However, if you are feeling well and you are not experiencing any problems, there is no need to return our call.  We will assume that you have returned to your regular daily activities without incident.  If any biopsies were taken you will be contacted by phone or by letter within the next 1-3 weeks.  Please call us at [phone] if you have not heard about the biopsies in 3 weeks.    SIGNATURES/CONFIDENTIALITY: You and/or your care partner have signed paperwork which will be entered into your electronic medical record.  These signatures attest to the fact that that the information above on your After Visit Summary has been reviewed and is understood.  Full responsibility of the confidentiality of this discharge information lies with you and/or your care-partner.   Resume medications. Follow up with Dr. Loletha Carrow in the office in 4 weeks.

## 2016-10-04 NOTE — Op Note (Signed)
[facility] Patient Name: Julian Garner Procedure Date: 10/04/2016 2:26 PM MRN: [id number] Endoscopist: [place]. Loletha Carrow , MD Age: 51 Referring MD:  Date of Birth: 1966/03/24 Gender: Male Account #: [id number] Procedure:                Colonoscopy Indications:              Disease activity assessment of Crohn's disease of                            the small bowel Medicines:                Monitored Anesthesia Care Procedure:                Pre-Anesthesia Assessment:                           - Prior to the procedure, a History and Physical                            was performed, and patient medications and                            allergies were reviewed. The patient's tolerance of                            previous anesthesia was also reviewed. The risks                            and benefits of the procedure and the sedation                            options and risks were discussed with the patient.                            All questions were answered, and informed consent                            was obtained. Prior Anticoagulants: The patient has                            taken no previous anticoagulant or antiplatelet                            agents. ASA Grade Assessment: III - A patient with                            severe systemic disease. After reviewing the risks                            and benefits, the patient was deemed in                            satisfactory condition to undergo the procedure.  After obtaining informed consent, the colonoscope                            was passed under direct vision. Throughout the                            procedure, the patient's blood pressure, pulse, and                            oxygen saturations were monitored continuously. The                            Model CF-HQ190L [id number]) scope was introduced                            through the anus and advanced to the 15 cm  into the                            ileum. The colonoscopy was performed without                            difficulty. The patient tolerated the procedure                            well. The quality of the bowel preparation was                            good. The bowel preparation used was SUPREP. The                            rectum was photographed. Scope In: 2:33:09 PM Scope Out: 2:43:35 PM Scope Withdrawal Time: 0 hours 8 minutes 38 seconds  Total Procedure Duration: 0 hours 10 minutes 26 seconds  Findings:                 The perianal and digital rectal examinations were                            normal. Pertinent negatives include no active                            peri-anal Crohn's disease.                           The neo-terminal ileum contained multiple 2-10 mm                            ulcers. Some of the mucosa was inflamed. The                            largest ulcer had the most inflammation resulting                            in mild lumenal narrowing that the scope was able  to pass. (Rutgeert's i3 score) No bleeding was                            present. No stigmata of recent bleeding were seen.                           There was evidence of a prior end-to-side                            ileo-colonic anastomosis at the hepatic flexure.                            This was patent and was characterized by                            ulceration. The anastomosis was easily traversed.                           Retroflexion in the rectum was not performed due to                            anatomy. Complications:            No immediate complications. Estimated Blood Loss:     Estimated blood loss: none. Impression:               - Multiple ulcers in the terminal ileum.                           - Patent end-to-side ileo-colonic anastomosis,                            characterized by ulceration.                           - No specimens  collected. Recommendation:           - Patient has a contact number available for                            emergencies. The signs and symptoms of potential                            delayed complications were discussed with the                            patient. Return to normal activities tomorrow.                            Written discharge instructions were provided to the                            patient.                           - Resume previous diet.                           -  Continue present medications.                           - Repeat colonoscopy (date not yet determined) to                            assess disease activity.                           - Return to my office in 4 weeks to discuss change                            to a different biologic agent. Infliximab at                            maximal dose of 10 mg/kg has not controlled this                            patient's Crohn's disease. Jasie Meleski L. Loletha Carrow, MD 10/04/2016 2:57:49 PM This report has been signed electronically.

## 2016-10-04 NOTE — Progress Notes (Signed)
Patient awakening,vss,report to rn 

## 2016-10-04 NOTE — Telephone Encounter (Signed)
This patient will need to change to a different biologic agent after his upcoming infliximab infusion.  He is aware after our discussion following his colonoscopy today.  I am either going to put him on Entyvio (vedolizumab) or Stelara (ustekinumab), and what his insurance will cover may make a difference.  Is there some way to know which of those two meds would be considered next line treatment after failing infliximab in a Medicare/Medicaid patient?

## 2016-10-05 ENCOUNTER — Telehealth: Payer: Self-pay

## 2016-10-05 NOTE — Telephone Encounter (Signed)
  Follow up Call-  Call back number 10/04/2016  Post procedure Call Back phone  # [phone]  Permission to leave phone message Yes  Some recent data might be hidden     Patient questions:  Do you have a fever, pain , or abdominal swelling? No. Pain Score  0 *  Have you tolerated food without any problems? Yes.    Have you been able to return to your normal activities? Yes.    Do you have any questions about your discharge instructions: Diet   No. Medications  No. Follow up visit  No.  Do you have questions or concerns about your Care? No.  Actions: * If pain score is 4 or above: No action needed, pain <4.

## 2016-10-10 ENCOUNTER — Other Ambulatory Visit: Payer: Self-pay

## 2016-10-10 DIAGNOSIS — K311 Adult hypertrophic pyloric stenosis: Secondary | ICD-10-CM

## 2016-10-12 ENCOUNTER — Telehealth: Payer: Self-pay | Admitting: Gastroenterology

## 2016-10-12 NOTE — Telephone Encounter (Signed)
Patient is working on getting his [place] medicaid. I have also scheduled him a follow up with Dr. Loletha Carrow in the office.

## 2016-10-31 ENCOUNTER — Other Ambulatory Visit (HOSPITAL_COMMUNITY): Payer: Self-pay | Admitting: *Deleted

## 2016-10-31 ENCOUNTER — Other Ambulatory Visit: Payer: Self-pay

## 2016-10-31 DIAGNOSIS — K5000 Crohn's disease of small intestine without complications: Secondary | ICD-10-CM

## 2016-11-03 ENCOUNTER — Other Ambulatory Visit: Payer: Self-pay

## 2016-11-03 ENCOUNTER — Ambulatory Visit (HOSPITAL_COMMUNITY)
Admission: RE | Admit: 2016-11-03 | Discharge: 2016-11-03 | Disposition: A | Discharge status: Home / Self Care | Payer: Medicare Other | Source: Ambulatory Visit | Attending: Gastroenterology | Admitting: Gastroenterology

## 2016-11-03 DIAGNOSIS — K5000 Crohn's disease of small intestine without complications: Secondary | ICD-10-CM | POA: Diagnosis not present

## 2016-11-03 MED ORDER — ACETAMINOPHEN 325 MG PO TABS: 650.0000 mg | ORAL_TABLET | Freq: Once | ORAL | Status: DC

## 2016-11-03 MED ORDER — SODIUM CHLORIDE 0.9 % IV SOLN
10.0000 mg/kg | Freq: Once | INTRAVENOUS | Status: AC
  Administered 2016-11-03: 13:00:00 900 mg via INTRAVENOUS
  Filled 2016-11-03: qty 90

## 2016-11-03 MED ORDER — DIPHENHYDRAMINE HCL 25 MG PO CAPS
ORAL_CAPSULE | ORAL | Status: AC
  Administered 2016-11-03: 13:00:00 50 mg
  Filled 2016-11-03: qty 2

## 2016-11-03 MED ORDER — ACETAMINOPHEN 325 MG PO TABS
ORAL_TABLET | ORAL | Status: AC
  Administered 2016-11-03: 650 mg
  Filled 2016-11-03: qty 2

## 2016-11-03 MED ORDER — CYANOCOBALAMIN 1000 MCG/ML IJ SOLN: 1000.0000 ug | Freq: Once | INTRAMUSCULAR | Status: DC

## 2016-11-03 MED ORDER — CYANOCOBALAMIN 1000 MCG/ML IJ SOLN
INTRAMUSCULAR | Status: AC
  Administered 2016-11-03: 15:00:00 1000 ug
  Filled 2016-11-03: qty 1

## 2016-11-03 MED ORDER — DIPHENHYDRAMINE HCL 25 MG PO CAPS
50.0000 mg | ORAL_CAPSULE | Freq: Once | ORAL | Status: DC
Start: 2016-11-03 — End: 2016-11-04

## 2016-11-10 ENCOUNTER — Telehealth: Payer: Self-pay

## 2016-11-10 NOTE — Telephone Encounter (Signed)
Spoke to [place] today, they are processing [place] request.

## 2016-11-10 NOTE — Telephone Encounter (Signed)
-----   Message from Doran Stabler, MD sent at 10/18/2016 11:31 AM EDT ----- Thank you for the info.  It's too bad, because I think Stelara might be a better med for him.  But we can only work with what we have available.  Please work on plans to start Entyvio in the near future.  - HD ----- Message ----- From: Doristine Counter, RN Sent: 10/12/2016   8:06 AM To: Doran Stabler, MD  Dr. Loletha Carrow,  After trying to have patient bring his Medicaid card in, it turns out that he does not have [place] Medicaid, only [place]. He has not followed up on [place] help. Therefore, it would be more cost effective to have him start Entyvio. He is already getting Remicade at the hospital. If he were to start Stelara, he would be responsible for the cost of the medication being sent to his home with his Medicare.  Hope this is helpful.  [name]

## 2016-11-14 ENCOUNTER — Ambulatory Visit (INDEPENDENT_AMBULATORY_CARE_PROVIDER_SITE_OTHER): Payer: Medicare Other | Admitting: Gastroenterology

## 2016-11-14 ENCOUNTER — Encounter: Payer: Self-pay | Admitting: Gastroenterology

## 2016-11-14 ENCOUNTER — Telehealth: Payer: Self-pay

## 2016-11-14 ENCOUNTER — Other Ambulatory Visit (INDEPENDENT_AMBULATORY_CARE_PROVIDER_SITE_OTHER): Payer: Medicare Other

## 2016-11-14 VITALS — BP 130/70 | HR 80 | Ht 74.0 in | Wt 185.8 lb

## 2016-11-14 DIAGNOSIS — R5382 Chronic fatigue, unspecified: Secondary | ICD-10-CM

## 2016-11-14 DIAGNOSIS — Z79899 Other long term (current) drug therapy: Secondary | ICD-10-CM

## 2016-11-14 DIAGNOSIS — R197 Diarrhea, unspecified: Secondary | ICD-10-CM

## 2016-11-14 DIAGNOSIS — E538 Deficiency of other specified B group vitamins: Secondary | ICD-10-CM | POA: Diagnosis not present

## 2016-11-14 DIAGNOSIS — Z7960 Long term (current) use of unspecified immunomodulators and immunosuppressants: Secondary | ICD-10-CM

## 2016-11-14 DIAGNOSIS — R1031 Right lower quadrant pain: Secondary | ICD-10-CM

## 2016-11-14 DIAGNOSIS — K5000 Crohn's disease of small intestine without complications: Secondary | ICD-10-CM | POA: Diagnosis not present

## 2016-11-14 LAB — CBC WITH DIFFERENTIAL/PLATELET
BASOS PCT: 1.5 % (ref 0.0–3.0)
Basophils Absolute: 0.1 10*3/uL (ref 0.0–0.1)
EOS ABS: 0.1 10*3/uL (ref 0.0–0.7)
Eosinophils Relative: 0.9 % (ref 0.0–5.0)
HCT: 31.8 % — ABNORMAL LOW (ref 39.0–52.0)
HEMOGLOBIN: 10.2 g/dL — AB (ref 13.0–17.0)
Lymphocytes Relative: 22.3 % (ref 12.0–46.0)
Lymphs Abs: 1.3 10*3/uL (ref 0.7–4.0)
MCHC: 31.9 g/dL (ref 30.0–36.0)
MCV: 91.7 fl (ref 78.0–100.0)
MONO ABS: 0.4 10*3/uL (ref 0.1–1.0)
Monocytes Relative: 7.6 % (ref 3.0–12.0)
Neutro Abs: 3.9 10*3/uL (ref 1.4–7.7)
Neutrophils Relative %: 67.7 % (ref 43.0–77.0)
Platelets: 369 10*3/uL (ref 150.0–400.0)
RBC: 3.47 Mil/uL — ABNORMAL LOW (ref 4.22–5.81)
RDW: 17.5 % — AB (ref 11.5–15.5)
WBC: 5.7 10*3/uL (ref 4.0–10.5)

## 2016-11-14 LAB — FERRITIN: Ferritin: 7.7 ng/mL — ABNORMAL LOW (ref 22.0–322.0)

## 2016-11-14 LAB — IBC PANEL
IRON: 55 ug/dL (ref 42–165)
Saturation Ratios: 10.2 % — ABNORMAL LOW (ref 20.0–50.0)
TRANSFERRIN: 387 mg/dL — AB (ref 212.0–360.0)

## 2016-11-14 LAB — FOLATE: Folate: 21 ng/mL (ref >5.9)

## 2016-11-14 LAB — VITAMIN B12: VITAMIN B 12: 331 pg/mL (ref 211–911)

## 2016-11-14 NOTE — Telephone Encounter (Deleted)
Records have been faxed to [facility] for NP appointment from office visit 11-14-2016. Will await appointment info.

## 2016-11-14 NOTE — Patient Instructions (Signed)
Your physician has requested that you go to the basement for lab work before leaving today.  If you are age 49 or older, your body mass index should be between 23-30. Your Body mass index is 23.86 kg/m. If this is out of the aforementioned range listed, please consider follow up with your Primary Care Provider.  If you are age 67 or younger, your body mass index should be between 19-25. Your Body mass index is 23.86 kg/m. If this is out of the aformentioned range listed, please consider follow up with your Primary Care Provider.   We have sent a referral to [facility] in [place].  If you have not heard from their office within 2 weeks please let us know. [phone].  Thank you for choosing [facility].  Dr. Wilfrid Lund

## 2016-11-14 NOTE — Progress Notes (Signed)
[facility] Progress Note  Chief Complaint: Ileal Crohn's disease  Subjective  History:  Julian Garner follows up after his recent colonoscopy. He has many years of ileal Crohn's disease with prior surgery. Most of his history predates his initial contact with our clinic last fall. He is been Remicade for years, but he has severely active ileal disease on recent colonoscopy. It is clear that the Remicade is not sufficiently controlling this condition. He describes generalized abdominal pain that is usually more focused in the right lower quadrant. He again asked me for pain medication, and says that tramadol has not helped in the past. I advised him that I would have to refer him to a pain clinic for chronic management of opioids. He has intermittent diarrhea and profound fatigue. Julian Garner reports that he is taking his B12 injections faithfully at home.  ROS: Cardiovascular:  no chest pain Respiratory: no dyspnea Remainder of systems negative except as above in history The patient's Past Medical, Family and Social History were reviewed and are on file in the EMR.  Objective:  Med list reviewed  Vital signs in last 24 hrs: Vitals:   11/14/16 1051  BP: 130/70  Pulse: 80    Physical Exam    HEENT: sclera anicteric, oral mucosa moist without lesions  Neck: supple, no thyromegaly, JVD or lymphadenopathy  Cardiac: RRR without murmurs, S1S2 heard, no peripheral edema  Pulm: clear to auscultation bilaterally, normal RR and effort noted  Abdomen: soft, No tenderness, with active bowel sounds. No guarding or palpable hepatosplenomegaly.  Skin; warm and dry, no jaundice or rash     @ASSESSMENTPLANBEGIN @ Assessment: Encounter Diagnoses  Name Primary?  . Crohn's disease of ileum without complication ([place]) Yes  . RLQ abdominal pain   . Diarrhea, unspecified type   . Chronic fatigue   . B12 deficiency   . Long-term use of immunosuppressant medication    Complicated case of  ileal Crohn's disease. He is at least an incomplete anti-TNF responder. I did not do his procedures before Remicade, so I do not know if he ever really had any endoscopic or symptomatic improvement. At any rate, he needs alternate biologic therapy since Remicade is not sufficiently controlling his Crohn's disease.Marland Kitchen He is at high risk for long-term complications of Crohn's including fistula, obstruction and need for surgery.  Unfortunately, he had difficulty getting approved for [facility] (is not entirely clear if this was due to his delay in submitting the request or from bureaucratic delay), but he believes it will happen soon. I suspect Medicaid might approve Entyvio (and our request has been submitted) , but I suspect it is unlikely they would approve Stelara. He understands these medicines work by different modes of action and probably have less systemic immune suppressive effect. He does not seem to be a good candidate for a medication study at an academic center since compliance has been an issue with him. He showed up to his recent colonoscopy without a care partner as instructed, and was just barely able to obtain one in order to have his procedure that day, albeit after delaying the entire endoscopy schedule.  Plan:  Proceed with institution of Entyvio if we can get approval. Continue Remicade in the meantime. I am referring him to a pain clinic if there is one available in the area that will except him. He has a chronic condition causing chronic pain, but I am uncomfortable managing chronic opioid medicines.  Check CBC, iron levels and B12  level today  Total time 30 minutes, over half spent in counseling and coordination of care.   Julian Garner

## 2016-11-15 ENCOUNTER — Other Ambulatory Visit: Payer: Self-pay

## 2016-11-15 ENCOUNTER — Telehealth: Payer: Self-pay

## 2016-11-15 MED ORDER — FERROUS SULFATE 325 (65 FE) MG PO TABS: 325.0000 mg | ORAL_TABLET | Freq: Two times a day (BID) | ORAL | 3 refills | Status: DC

## 2016-11-15 NOTE — Telephone Encounter (Signed)
Received fax from [facility], that Entyvio infusion is covered through 05/21/17. I called to confirm that this will be approved for patient to have this done at the hospital outpatient infusion center. Per Johna Roles, [place], he is approved to have this done in that setting, through 05/21/17. Will notify patient and Dr. Loletha Carrow, to see when this new medication will need to be started.

## 2016-11-15 NOTE — Telephone Encounter (Signed)
ERROR CORRECTION :  Records have been sent to [facility] pain management clinic in [place] for review, not [facility].

## 2016-11-17 ENCOUNTER — Other Ambulatory Visit (HOSPITAL_COMMUNITY): Payer: Self-pay

## 2016-11-18 NOTE — Telephone Encounter (Signed)
Excellent.   Inform infusion center that Remicade is discontinued and no further infusions of that will be planned.  Please begin Entyvio (vedolizumab) as soon as possible. It is given as a 300 mg IV infusion at weeks 0, 2, 6, and every eight weeks thereafter.  Please arrange office follow up for Mr. Julian Garner in 3 months.

## 2016-11-21 ENCOUNTER — Other Ambulatory Visit: Payer: Self-pay

## 2016-11-21 DIAGNOSIS — K5000 Crohn's disease of small intestine without complications: Secondary | ICD-10-CM

## 2016-11-21 DIAGNOSIS — K5010 Crohn's disease of large intestine without complications: Secondary | ICD-10-CM

## 2016-11-21 MED ORDER — VEDOLIZUMAB 300 MG IV SOLR: 300.0000 mg | Freq: Once | INTRAVENOUS | Status: DC

## 2016-11-21 NOTE — Telephone Encounter (Signed)
Left message for patient that he is approved for Entyvio, Dr. Loletha Carrow would like to start doing this right away. Asked patient to contact our office so that I can schedule first infusion on a day/time that is convenient for him.

## 2016-11-21 NOTE — Telephone Encounter (Signed)
Patient is scheduled for his first Entyvio infusion on 11/29/16 at 11:00 [facility] short stay. Order in [place]. Patient aware of first appointment. I also let him know that a recall is in the system to have him schedule a 3 month follow up appointment, due in October.

## 2016-11-28 NOTE — Telephone Encounter (Addendum)
[facility] will not see the patient. He has medicare and medicaid. Per Thurmond Butts the new patient referral specialist they don't take his insurance (no combination of medication). Will have to send to the [facility] pain management office.Pt has been notified and aware. He agrees to try the [facility] location.

## 2016-11-29 ENCOUNTER — Other Ambulatory Visit: Payer: Self-pay

## 2016-11-29 ENCOUNTER — Telehealth: Payer: Self-pay | Admitting: Gastroenterology

## 2016-11-29 ENCOUNTER — Ambulatory Visit (HOSPITAL_COMMUNITY)
Admission: RE | Admit: 2016-11-29 | Discharge: 2016-11-29 | Disposition: A | Discharge status: Home / Self Care | Payer: Medicare Other | Source: Ambulatory Visit | Attending: Gastroenterology | Admitting: Gastroenterology

## 2016-11-29 ENCOUNTER — Telehealth: Payer: Self-pay

## 2016-11-29 DIAGNOSIS — K50119 Crohn's disease of large intestine with unspecified complications: Secondary | ICD-10-CM

## 2016-11-29 DIAGNOSIS — K5000 Crohn's disease of small intestine without complications: Secondary | ICD-10-CM

## 2016-11-29 DIAGNOSIS — K50118 Crohn's disease of large intestine with other complication: Secondary | ICD-10-CM

## 2016-11-29 MED ORDER — VEDOLIZUMAB 300 MG IV SOLR
300.0000 mg | Freq: Once | INTRAVENOUS | Status: AC
  Administered 2016-11-29: 300 mg via INTRAVENOUS
  Filled 2016-11-29: qty 5

## 2016-11-29 NOTE — Telephone Encounter (Signed)
Renee from [facility] states that order for Entyvio needs to be for more than one time dosing. She said to free text med needs to be repeated in 2 weeks after first dose and then 6 weeks afterwards.

## 2016-11-29 NOTE — Progress Notes (Signed)
Pt was upset when he left. He was at the secretaries desk speaking in a gruff tone to the secretary and saying he didn't understand why she couldn't call the Dr's office right now.  She told him she couldn't do that and I stepped in to assist.  He was mad that I couldn't call the Dr right then and find out and give him an answer immediately as to when his next appointment would be.  I explained to him that it was a process of calling the office and getting orders back, and I would not get the answer right away.  I assured him I would personally call the Dr and I would personally call him back when I had then answer His orders were for a one time dose.  HE was under the impression that he was to come back in 2 weeks and then again in 6 weeks.  I called Dr Corena Pilgrim office and spoke to the secretary.  She said that she would take a note and give it to a nurse and the nurse would call me back.

## 2016-11-29 NOTE — Telephone Encounter (Signed)
Entyvio order re-entered

## 2016-12-04 ENCOUNTER — Telehealth: Payer: Self-pay | Admitting: Gastroenterology

## 2016-12-04 NOTE — Telephone Encounter (Signed)
Pt has appointment for 12-05-2016 at 940 am with PA [employer] with preferred pain management in [place]. Left a message for the pt for appointment details.

## 2016-12-04 NOTE — Telephone Encounter (Signed)
Mr Pacholski returned call, He is aware of the appointment with pain management.

## 2016-12-05 ENCOUNTER — Telehealth: Payer: Self-pay | Admitting: Gastroenterology

## 2016-12-05 NOTE — Telephone Encounter (Signed)
Thank you for informing me. No further action is needed.

## 2016-12-18 ENCOUNTER — Ambulatory Visit (HOSPITAL_COMMUNITY)
Admission: RE | Admit: 2016-12-18 | Discharge: 2016-12-18 | Disposition: A | Discharge status: Home / Self Care | Payer: Medicare Other | Source: Ambulatory Visit | Attending: Gastroenterology | Admitting: Gastroenterology

## 2016-12-18 DIAGNOSIS — K50119 Crohn's disease of large intestine with unspecified complications: Secondary | ICD-10-CM | POA: Insufficient documentation

## 2016-12-18 MED ORDER — VEDOLIZUMAB 300 MG IV SOLR
300.0000 mg | INTRAVENOUS | Status: DC
  Administered 2016-12-18: 10:00:00 300 mg via INTRAVENOUS
  Filled 2016-12-18: qty 5

## 2016-12-20 ENCOUNTER — Telehealth: Payer: Self-pay

## 2016-12-20 MED ORDER — FERROUS SULFATE 325 (65 FE) MG PO TABS: 325.0000 mg | ORAL_TABLET | Freq: Two times a day (BID) | ORAL | 0 refills | Status: AC

## 2016-12-20 NOTE — Telephone Encounter (Signed)
Incoming fax request for ferrous sulfate 364m one BID. Pt requesting a 90 days supply. Last seen on 11-14-2016. Please advise.

## 2016-12-20 NOTE — Telephone Encounter (Signed)
Yes, please fill for a 90 day supply with no refill

## 2016-12-29 ENCOUNTER — Encounter (HOSPITAL_COMMUNITY): Payer: Medicare Other

## 2017-01-12 ENCOUNTER — Other Ambulatory Visit (HOSPITAL_COMMUNITY): Payer: Self-pay | Admitting: *Deleted

## 2017-01-15 ENCOUNTER — Ambulatory Visit (HOSPITAL_COMMUNITY): Admission: RE | Admit: 2017-01-15 | Payer: Medicare Other | Source: Ambulatory Visit

## 2017-01-16 ENCOUNTER — Encounter: Payer: Self-pay | Admitting: Gastroenterology

## 2017-01-17 ENCOUNTER — Encounter (HOSPITAL_COMMUNITY): Payer: Medicare Other

## 2017-01-23 ENCOUNTER — Other Ambulatory Visit (HOSPITAL_COMMUNITY): Payer: Self-pay | Admitting: *Deleted

## 2017-01-24 ENCOUNTER — Ambulatory Visit (HOSPITAL_COMMUNITY)
Admission: RE | Admit: 2017-01-24 | Discharge: 2017-01-24 | Disposition: A | Discharge status: Home / Self Care | Payer: Medicare Other | Source: Ambulatory Visit | Attending: Gastroenterology | Admitting: Gastroenterology

## 2017-01-24 DIAGNOSIS — K50119 Crohn's disease of large intestine with unspecified complications: Secondary | ICD-10-CM

## 2017-01-24 MED ORDER — VEDOLIZUMAB 300 MG IV SOLR
300.0000 mg | INTRAVENOUS | Status: DC
  Administered 2017-01-24: 300 mg via INTRAVENOUS
  Filled 2017-01-24: qty 5

## 2017-03-20 ENCOUNTER — Other Ambulatory Visit: Payer: Self-pay

## 2017-03-20 ENCOUNTER — Telehealth: Payer: Self-pay | Admitting: Gastroenterology

## 2017-03-20 ENCOUNTER — Encounter: Payer: Self-pay | Admitting: Gastroenterology

## 2017-03-20 ENCOUNTER — Encounter: Payer: Self-pay | Admitting: Internal Medicine

## 2017-03-20 DIAGNOSIS — K50919 Crohn's disease, unspecified, with unspecified complications: Secondary | ICD-10-CM

## 2017-03-21 ENCOUNTER — Encounter (HOSPITAL_COMMUNITY)
Admission: RE | Admit: 2017-03-21 | Discharge: 2017-03-21 | Disposition: A | Discharge status: Home / Self Care | Payer: Medicare Other | Source: Ambulatory Visit | Attending: Gastroenterology | Admitting: Gastroenterology

## 2017-03-21 DIAGNOSIS — K50919 Crohn's disease, unspecified, with unspecified complications: Secondary | ICD-10-CM | POA: Insufficient documentation

## 2017-03-21 MED ORDER — SODIUM CHLORIDE 0.9 % IV SOLN
300.0000 mg | INTRAVENOUS | Status: DC
  Administered 2017-03-21: 300 mg via INTRAVENOUS
  Filled 2017-03-21: qty 5

## 2017-03-21 MED ORDER — CYANOCOBALAMIN 1000 MCG/ML IJ SOLN
1000.0000 ug | Freq: Once | INTRAMUSCULAR | Status: AC
  Administered 2017-03-21: 12:00:00 1000 ug via INTRAMUSCULAR

## 2017-03-21 MED ORDER — CYANOCOBALAMIN 1000 MCG/ML IJ SOLN
INTRAMUSCULAR | Status: AC
  Filled 2017-03-21: qty 1

## 2017-05-09 ENCOUNTER — Other Ambulatory Visit: Payer: Self-pay

## 2017-05-09 ENCOUNTER — Encounter (HOSPITAL_COMMUNITY): Payer: Self-pay | Admitting: Emergency Medicine

## 2017-05-09 ENCOUNTER — Ambulatory Visit (HOSPITAL_COMMUNITY)
Admission: EM | Admit: 2017-05-09 | Discharge: 2017-05-09 | Disposition: A | Discharge status: Home / Self Care | Payer: Medicare Other | Attending: Family Medicine | Admitting: Family Medicine

## 2017-05-09 DIAGNOSIS — S161XXA Strain of muscle, fascia and tendon at neck level, initial encounter: Secondary | ICD-10-CM | POA: Diagnosis not present

## 2017-05-09 MED ORDER — CYCLOBENZAPRINE HCL 10 MG PO TABS: 10.0000 mg | ORAL_TABLET | Freq: Two times a day (BID) | ORAL | 0 refills | Status: DC | PRN
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
## 2017-05-09 NOTE — Discharge Instructions (Signed)
Ibuprofen, take with food. Muscle relaxer, at night, do not take with alcohol or if driving as may cause drowsiness. Please see provided exercises. Please follow up with your primary care provider in the next 2 weeks for recheck of your neck and blood pressure.

## 2017-05-16 ENCOUNTER — Ambulatory Visit (HOSPITAL_COMMUNITY)
Admission: RE | Admit: 2017-05-16 | Discharge: 2017-05-16 | Disposition: A | Discharge status: Home / Self Care | Payer: Medicare Other | Source: Ambulatory Visit | Attending: Gastroenterology | Admitting: Gastroenterology

## 2017-05-16 ENCOUNTER — Other Ambulatory Visit: Payer: Self-pay

## 2017-05-16 ENCOUNTER — Telehealth: Payer: Self-pay

## 2017-05-16 DIAGNOSIS — K50919 Crohn's disease, unspecified, with unspecified complications: Secondary | ICD-10-CM | POA: Diagnosis not present

## 2017-05-16 MED ORDER — SODIUM CHLORIDE 0.9 % IV SOLN
300.0000 mg | INTRAVENOUS | Status: DC
  Administered 2017-05-16: 300 mg via INTRAVENOUS
  Filled 2017-05-16: qty 5

## 2017-05-16 MED ORDER — CYANOCOBALAMIN 1000 MCG/ML IJ SOLN: 1000.0000 ug | INTRAMUSCULAR | Status: DC

## 2017-05-16 MED ORDER — CYANOCOBALAMIN 1000 MCG/ML IJ SOLN
INTRAMUSCULAR | Status: AC
Start: 2017-05-16 — End: 2017-05-16
  Administered 2017-05-16: 1000 ug
  Filled 2017-05-16: qty 1

## 2017-05-16 NOTE — Telephone Encounter (Signed)
Spoke to [place] at [facility] short stay, patient is there for his infusion. He also wanted to get his vit B12 IM injection while there, this is ordered every 30 days. Last time he received this was at his last infusion on 03/21/17. Asked her to remind patient that this needs to be every 30 days, if he wants to get it there he needs to let us know in order to make proper arrangements and orders in Epic.

## 2017-06-13 ENCOUNTER — Ambulatory Visit (HOSPITAL_COMMUNITY)
Admission: RE | Admit: 2017-06-13 | Discharge: 2017-06-13 | Disposition: A | Discharge status: Home / Self Care | Payer: Medicare Other | Source: Ambulatory Visit | Attending: Gastroenterology | Admitting: Gastroenterology

## 2017-06-13 DIAGNOSIS — E538 Deficiency of other specified B group vitamins: Secondary | ICD-10-CM | POA: Insufficient documentation

## 2017-06-13 DIAGNOSIS — K5000 Crohn's disease of small intestine without complications: Secondary | ICD-10-CM | POA: Insufficient documentation

## 2017-06-13 MED ORDER — CYANOCOBALAMIN 1000 MCG/ML IJ SOLN
1000.0000 ug | INTRAMUSCULAR | Status: DC
  Administered 2017-06-13: 1000 ug via INTRAMUSCULAR

## 2017-06-13 MED ORDER — CYANOCOBALAMIN 1000 MCG/ML IJ SOLN
INTRAMUSCULAR | Status: AC
  Administered 2017-06-13: 11:00:00 1000 ug via INTRAMUSCULAR
  Filled 2017-06-13: qty 1

## 2017-06-20 NOTE — Telephone Encounter (Signed)
Done

## 2017-07-03 ENCOUNTER — Telehealth: Payer: Self-pay | Admitting: Gastroenterology

## 2017-07-03 NOTE — Telephone Encounter (Signed)
Follow up scheduled for 08-20-2017  @ 130 pm.

## 2017-07-03 NOTE — Telephone Encounter (Signed)
Please contact this patient and make him an appointment to see me by the end of next month for his Crohn's disease.  He has not seen me since June 2018 and is on Entyvio.

## 2017-07-11 ENCOUNTER — Ambulatory Visit (HOSPITAL_COMMUNITY): Admission: RE | Admit: 2017-07-11 | Payer: Medicare Other | Source: Ambulatory Visit

## 2017-07-19 ENCOUNTER — Ambulatory Visit (HOSPITAL_COMMUNITY)
Admission: RE | Admit: 2017-07-19 | Discharge: 2017-07-19 | Disposition: A | Discharge status: Home / Self Care | Payer: Medicare Other | Source: Ambulatory Visit | Attending: Gastroenterology | Admitting: Gastroenterology

## 2017-07-19 ENCOUNTER — Telehealth: Payer: Self-pay

## 2017-07-19 DIAGNOSIS — K50919 Crohn's disease, unspecified, with unspecified complications: Secondary | ICD-10-CM

## 2017-07-19 MED ORDER — VEDOLIZUMAB 300 MG IV SOLR: 300.0000 mg | INTRAVENOUS | Status: DC

## 2017-07-19 MED ORDER — CYANOCOBALAMIN 1000 MCG/ML IJ SOLN: 1000.0000 ug | INTRAMUSCULAR | Status: DC

## 2017-07-19 NOTE — Telephone Encounter (Signed)
Do not give entyvio today  If he is having chest pain today, go to ED  If he has been having chronic , intermittent exertional chest pain, he needs to see his PCP today or tomorrow.  He has an upcoming office appointment with me.

## 2017-07-19 NOTE — Progress Notes (Signed)
Pt was here today for Entyvio infusion and B12 injection.  Upon talking with pt prior to infusion, he stated that he has had blood in his stool for approx 1 month and also having chest pain and shortness of breath with exertion. He is scheduled for Entyvio every 8 weeks, but is at 9 weeks due to canceling last week. Office was called and given this information.  We were advised to not give infusion or injection and to have pt go to the ER if having chest pain.  Otherwise, he needs to follow up with his primary care in the next few days.  Pt states that he does not have a primary care.  I instructed him that he could go to an urgent care for followup or to the ER if  his chest pain returns.  Also, he was instructed to keep his 08/20/17 appt with Dr Loletha Carrow.  Pt voices understanding.

## 2017-07-19 NOTE — Telephone Encounter (Signed)
Medical Day nurse called, patient is there for Entyvio infusion. He reported to her that for last month has had blood in his stool. He has also recently had chest pain upon exertion. They have not given him his infusion today (1 week late due to no show). Advised nurse to send patient to ED for evaluation of chest pain. He has appointment here on April 1.

## 2017-07-19 NOTE — Telephone Encounter (Signed)
Spoke to [place] at Medical Day, patient is asleep currently. She will relay information to patient to follow up with PCP today or tomorrow, keep appointment with our office for 08/20/17. They will not give any treatment today.

## 2017-08-20 ENCOUNTER — Telehealth: Payer: Self-pay | Admitting: Gastroenterology

## 2017-08-20 ENCOUNTER — Encounter: Payer: Self-pay | Admitting: Gastroenterology

## 2017-08-20 ENCOUNTER — Other Ambulatory Visit (INDEPENDENT_AMBULATORY_CARE_PROVIDER_SITE_OTHER): Payer: Medicare Other

## 2017-08-20 ENCOUNTER — Ambulatory Visit (INDEPENDENT_AMBULATORY_CARE_PROVIDER_SITE_OTHER): Payer: Medicare Other | Admitting: Gastroenterology

## 2017-08-20 ENCOUNTER — Encounter (INDEPENDENT_AMBULATORY_CARE_PROVIDER_SITE_OTHER): Payer: Self-pay

## 2017-08-20 VITALS — BP 150/74 | HR 96 | Ht 72.5 in | Wt 195.1 lb

## 2017-08-20 DIAGNOSIS — R197 Diarrhea, unspecified: Secondary | ICD-10-CM | POA: Diagnosis not present

## 2017-08-20 DIAGNOSIS — Z7960 Long term (current) use of unspecified immunomodulators and immunosuppressants: Secondary | ICD-10-CM

## 2017-08-20 DIAGNOSIS — R1031 Right lower quadrant pain: Secondary | ICD-10-CM | POA: Diagnosis not present

## 2017-08-20 DIAGNOSIS — R079 Chest pain, unspecified: Secondary | ICD-10-CM | POA: Diagnosis not present

## 2017-08-20 DIAGNOSIS — D500 Iron deficiency anemia secondary to blood loss (chronic): Secondary | ICD-10-CM | POA: Diagnosis not present

## 2017-08-20 DIAGNOSIS — Z79899 Other long term (current) drug therapy: Secondary | ICD-10-CM

## 2017-08-20 DIAGNOSIS — K5000 Crohn's disease of small intestine without complications: Secondary | ICD-10-CM | POA: Diagnosis not present

## 2017-08-20 DIAGNOSIS — E538 Deficiency of other specified B group vitamins: Secondary | ICD-10-CM | POA: Diagnosis not present

## 2017-08-20 DIAGNOSIS — R799 Abnormal finding of blood chemistry, unspecified: Secondary | ICD-10-CM

## 2017-08-20 LAB — COMPREHENSIVE METABOLIC PANEL
ALT: 12 U/L (ref 0–53)
AST: 20 U/L (ref 0–37)
Albumin: 3.6 g/dL (ref 3.5–5.2)
Alkaline Phosphatase: 75 U/L (ref 39–117)
BILIRUBIN TOTAL: 0.4 mg/dL (ref 0.2–1.2)
BUN: 15 mg/dL (ref 6–23)
CALCIUM: 9 mg/dL (ref 8.4–10.5)
CHLORIDE: 106 meq/L (ref 96–112)
CO2: 22 meq/L (ref 19–32)
Creatinine, Ser: 1.42 mg/dL (ref 0.40–1.50)
GFR: 67.94 mL/min (ref >60.00)
Glucose, Bld: 102 mg/dL — ABNORMAL HIGH (ref 70–99)
Potassium: 3.8 mEq/L (ref 3.5–5.1)
Sodium: 136 mEq/L (ref 135–145)
Total Protein: 7.9 g/dL (ref 6.0–8.3)

## 2017-08-20 LAB — CBC WITH DIFFERENTIAL/PLATELET
BASOS PCT: 0.8 % (ref 0.0–3.0)
Eosinophils Relative: 1.9 % (ref 0.0–5.0)
HCT: 25.4 % — ABNORMAL LOW (ref 39.0–52.0)
Hemoglobin: 8 g/dL — CL (ref 13.0–17.0)
LYMPHS PCT: 29.3 % (ref 12.0–46.0)
MCHC: 31.5 g/dL (ref 30.0–36.0)
MCV: 83.9 fl (ref 78.0–100.0)
Monocytes Relative: 7.6 % (ref 3.0–12.0)
Neutrophils Relative %: 60.4 % (ref 43.0–77.0)
PLATELETS: 429 10*3/uL — AB (ref 150.0–400.0)
RBC: 3.02 Mil/uL — AB (ref 4.22–5.81)
RDW: 24.5 % — ABNORMAL HIGH (ref 11.5–15.5)
WBC: 6.3 10*3/uL (ref 4.0–10.5)

## 2017-08-20 LAB — B12 AND FOLATE PANEL
FOLATE: 13.5 ng/mL (ref >5.9)
Vitamin B-12: 165 pg/mL — ABNORMAL LOW (ref 211–911)

## 2017-08-20 LAB — SEDIMENTATION RATE: Sed Rate: 130 mm/hr — ABNORMAL HIGH (ref 0–15)

## 2017-08-20 LAB — IBC PANEL
IRON: 19 ug/dL — AB (ref 42–165)
SATURATION RATIOS: 4.2 % — AB (ref 20.0–50.0)
TRANSFERRIN: 321 mg/dL (ref 212.0–360.0)

## 2017-08-20 LAB — FERRITIN: FERRITIN: 8.5 ng/mL — AB (ref 22.0–322.0)

## 2017-08-20 NOTE — Patient Instructions (Signed)
If you are age 50 or older, your body mass index should be between 23-30. Your Body mass index is 26.1 kg/m. If this is out of the aforementioned range listed, please consider follow up with your Primary Care Provider.  If you are age 51 or younger, your body mass index should be between 19-25. Your Body mass index is 26.1 kg/m. If this is out of the aformentioned range listed, please consider follow up with your Primary Care Provider.   Your physician has requested that you go to the basement for lab work before leaving today. After you get your lab work done today please go to Primary Care on the first floor and schedule a new patient appointment.  Call to schedule a time to bring your B12 into the clinic one day for your injection.  [name] will contact you regarding Entyvio.   Thank you for choosing [facility]  Dr Wilfrid Lund III

## 2017-08-20 NOTE — Telephone Encounter (Signed)
Left message for [facility] short stay to call back, need to schedule Entyvio infusion sooner.

## 2017-08-20 NOTE — Progress Notes (Signed)
[facility] Progress Note  Chief Complaint: Ileal Crohn's disease  Subjective  History:  This is a follow-up for a 52 year old man with a long-standing history of ileal Crohn's disease, last seen in clinic November 14, 2016.  He did not respond well to 10  milligram per kilogram dosing of infliximab, active disease to correlate with symptoms on colonoscopy 10/04/2016.  He was then started on Entyvio.  He did not show for one infusion on January 15, 2017, he received it on 01/24/2017, 03/21/2017, 05/16/2017, 06/13/2016.  Infusion on 07/11/2017 was canceled because he did not show, and then infusion on 07/19/2017 was canceled because the patient was describing  chronic chest pain.  My advice was that if the patient was having chronic intermittent chest pain, he should see primary care that day or the next.  If he was having chest pain that day, he should be referred to the emergency room.  I do not see any emergency department encounters in our hospital system at that time. When I last saw Rohith in June 2018, he continued to have chronic right lower quadrant pain that reportedly did not improved with tramadol.  I did not feel comfortable managing chronic opioid therapy, so I referred him to a local pain clinic.  As near as I can determine, they declined to see him.  Arthor feels that the that Ent was helping decrease his abdominal pain and diarrhea.   He continues to have right lower quadrant pain and 5-6 semi-formed BMs per day.  He has had some rare rectal bleeding, most recently about a month ago.  His appetite is generally been good and his weight stable.  He appears to fluctuate between about 185-195 pounds.  He denies dysphagia or vomiting.  Unfortunately, he continues to smoke. He either did not receive or misunderstood the instructions to see primary care for chest pain as noted above, and in fact does not have a primary care provider.  He was not aware that he should have one, and thought  that I could provide his primary care.  I have told him that I can attend to his GI issues, but he missed make a new patient appointment with primary care ASAP, and was instructed to see the practice in our building to do so today. He describes the chest pain as a "cold feeling" that might happen with certain movements, not with exertion.  He denies dyspnea, but has continued fatigue and says he feels cold all the time. ROS: Remainder of systems are negative except as in history.   The patient's Past Medical, Family and Social History were reviewed and are on file in the EMR.  Objective:  Med list reviewed  Current Outpatient Medications:  .  cyanocobalamin (,VITAMIN B-12,) 1000 MCG/ML injection, Inject 1 mL (1,000 mcg total) into the muscle every 30 (thirty) days., Disp: 10 mL, Rfl: 0 .  cyclobenzaprine (FLEXERIL) 10 MG tablet, Take 1 tablet (10 mg total) by mouth 2 (two) times daily as needed for muscle spasms., Disp: 20 tablet, Rfl: 0 .  ferrous sulfate (FERROUSUL) 325 (65 FE) MG tablet, Take 1 tablet (325 mg total) by mouth 2 (two) times daily with a meal., Disp: 90 tablet, Rfl: 0 .  hydrOXYzine (ATARAX/VISTARIL) 10 MG tablet, Take 1 tablet (10 mg total) by mouth every 6 (six) hours as needed for itching., Disp: 30 tablet, Rfl: 0 .  triamcinolone cream (KENALOG) 0.1 %, Apply 1 application topically 2 (two) times daily., Disp: 30  g, Rfl: 0  Current Facility-Administered Medications:  .  0.9 %  sodium chloride infusion, 500 mL, Intravenous, Continuous, Danis, Estill Cotta III, MD .  vedolizumab (ENTYVIO) 300 mg in sodium chloride 0.9 % 250 mL infusion, 300 mg, Intravenous, Once, Danis, Estill Cotta III, MD   Vital signs in last 24 hrs: Vitals:   08/20/17 1326  BP: (!) 150/74  Pulse: 96    Physical Exam  Not acutely ill-appearing  HEENT: sclera anicteric, oral mucosa moist without lesions  Neck: supple, no thyromegaly, JVD or lymphadenopathy  Cardiac: RRR without murmurs, S1S2 heard, no  peripheral edema  Pulm: clear to auscultation bilaterally, normal RR and effort noted  Abdomen: soft, mild RLQ tenderness, with active bowel sounds. No guarding or palpable hepatosplenomegaly.  Skin; warm and dry, no jaundice or rash  Recent Labs:  No recent labs or imaging   @ASSESSMENTPLANBEGIN @ Assessment: Encounter Diagnoses  Name Primary?  . Crohn's disease of small intestine without complication ([place]) Yes  . RLQ abdominal pain   . Diarrhea, unspecified type   . Vitamin B12 deficiency   . Iron deficiency anemia due to chronic blood loss   . Long-term use of immunosuppressant medication   . Chest pain, unspecified type     He has ongoing ileal Crohn's disease symptoms under questionable control with current therapy.  He has chronic iron deficiency anemia, status unknown as he has not followed up regularly.  He continues iron supplements, has not had a B12 injection in a couple of months.  It is unclear how much Weyman Rodney is improving his symptoms.  He needs a colonoscopy to evaluate the Crohn's ileitis, but I feel he needs primary care evaluation and perhaps cardiac stress testing prior to his colonoscopy.  I believe anesthesia would require this prior to sedating him for procedure.  Plan: CBC, CMP, iron studies, B12 and folate, ESR and TB testing today. He was instructed to see primary care in our building while here today and make a new patient appointment ASAP.  Then I can know the status of his chest pain and any necessary workup so we can plan colonoscopy. We will schedule Entyvio infusion in the meantime.   Total time 30 minutes, over half spent in counseling and coordination of care.   Nelida Meuse III

## 2017-08-20 NOTE — Telephone Encounter (Signed)
Please schedule an infusion of vedolizumab 300 mg for this patient.  Also, he has B12 at home and needs to bring it in to you one day soon for an injection.

## 2017-08-21 ENCOUNTER — Other Ambulatory Visit: Payer: Self-pay

## 2017-08-21 DIAGNOSIS — K5000 Crohn's disease of small intestine without complications: Secondary | ICD-10-CM

## 2017-08-21 DIAGNOSIS — D500 Iron deficiency anemia secondary to blood loss (chronic): Secondary | ICD-10-CM

## 2017-08-21 LAB — PATHOLOGIST SMEAR REVIEW

## 2017-08-21 NOTE — Telephone Encounter (Signed)
Left message for patient to call back, see new result note and Dr. Loletha Carrow' new treatment plan. Scheduled Ct ab/pelvis and Feraheme IV x 2.

## 2017-08-23 ENCOUNTER — Encounter: Payer: Self-pay | Admitting: Family Medicine

## 2017-08-23 ENCOUNTER — Ambulatory Visit (INDEPENDENT_AMBULATORY_CARE_PROVIDER_SITE_OTHER): Payer: Medicare Other | Admitting: Family Medicine

## 2017-08-23 VITALS — BP 144/62 | HR 78 | Temp 98.4°F | Ht 72.96 in | Wt 195.0 lb

## 2017-08-23 DIAGNOSIS — Z13220 Encounter for screening for lipoid disorders: Secondary | ICD-10-CM

## 2017-08-23 DIAGNOSIS — R079 Chest pain, unspecified: Secondary | ICD-10-CM

## 2017-08-23 DIAGNOSIS — K5000 Crohn's disease of small intestine without complications: Secondary | ICD-10-CM

## 2017-08-23 DIAGNOSIS — I10 Essential (primary) hypertension: Secondary | ICD-10-CM | POA: Diagnosis not present

## 2017-08-23 DIAGNOSIS — Z7689 Persons encountering health services in other specified circumstances: Secondary | ICD-10-CM | POA: Diagnosis not present

## 2017-08-23 DIAGNOSIS — K5090 Crohn's disease, unspecified, without complications: Secondary | ICD-10-CM | POA: Insufficient documentation

## 2017-08-23 MED ORDER — AMLODIPINE BESYLATE 2.5 MG PO TABS
2.5000 mg | ORAL_TABLET | Freq: Every day | ORAL | 2 refills | Status: DC
Start: 2017-08-23 — End: 2017-11-26

## 2017-08-23 NOTE — Progress Notes (Signed)
Patient presents to clinic today to establish care.  SUBJECTIVE: PMH: Pt is a 52 yo with pmh sig for crohn's dz, HTN, HAs.  Pt is followed by Dr. Loletha Carrow, GI.  Pt was formerly seen by [facility] in [place], [place].    Chest Pain: -ongoing for "a while" -noticed with exertion and when "moves too fast".  Pt states if he tries to rake the yard he would be unable to complete the task. -Feeling starts off as a cold sensation that turns to a sharp, 8/10, substernal pain with increased HR. -pt may have some dizziness. -feeling relieved with sitting and drinking water. -may last 10-15 min. -Patient denies diaphoresis and numbness during chest discomfort.  May have some tingling in fingers.   -Able to lie flat to sleep.  H/o hypertension: -Pt states in the past he was on bp medicine but cannot recall the name. -In the past pt checked his BP at home but has not been doing that recently -Pt not exercising -Pt may drink 2 bottles of water per day -Doing less cooking at home.  Crohn's disease: -Patient is followed by Dr. Loletha Carrow, GI -Patient currently on Entyvio infusions -Also getting vitamin B12 injections, last one was on Monday per pt. -Also taking OTC ferrous sulfate 325 mg daily -colonoscopy to be scheduled.  Allergies: NKDA  Past Surg Hx: ileostomy with reversal Colostomy with reversal  Social history: Patient is single.  He has 2 children ages 33 and 26.  Patient is currently on disability.  Patient denies tobacco and drug use.  Patient endorses occasional alcohol use.  Family medical history: Mom-alive, HLD Dad-deceased, alcohol abuse, arthritis, diabetes Brother-Brian, alive Daughter-alive Son-alive MGM-alive, asthma, diabetes, hearing loss, HLD MGF-deceased, asthma, stroke  Health Maintenance: Immunizations --Pneumovax 2016, influenza vaccine 2017, TB skin test, 2018 Colonoscopy --2018.  Next colonoscopy to be determined   Of note: at the end of visit pt  mentions needing pain medicine for ongoing abdominal pain. Pt advised this provider does not prescribe opioids.  Pt asked about pain management appointment.  Pt states he left and was not seen because they asked to take his picture and see his photo ID.  Past Medical History:  Diagnosis Date  . Anemia   . Crohn's colitis ([place])   . Hyperlipidemia     Past Surgical History:  Procedure Laterality Date  . COLOSTOMY    . ILEOSTOMY      Current Outpatient Medications on File Prior to Visit  Medication Sig Dispense Refill  . cyanocobalamin (,VITAMIN B-12,) 1000 MCG/ML injection Inject 1 mL (1,000 mcg total) into the muscle every 30 (thirty) days. 10 mL 0  . cyclobenzaprine (FLEXERIL) 10 MG tablet Take 1 tablet (10 mg total) by mouth 2 (two) times daily as needed for muscle spasms. 20 tablet 0  . ferrous sulfate (FERROUSUL) 325 (65 FE) MG tablet Take 1 tablet (325 mg total) by mouth 2 (two) times daily with a meal. 90 tablet 0  . hydrOXYzine (ATARAX/VISTARIL) 10 MG tablet Take 1 tablet (10 mg total) by mouth every 6 (six) hours as needed for itching. 30 tablet 0  . triamcinolone cream (KENALOG) 0.1 % Apply 1 application topically 2 (two) times daily. 30 g 0   Current Facility-Administered Medications on File Prior to Visit  Medication Dose Route Frequency Provider Last Rate Last Dose  . 0.9 %  sodium chloride infusion  500 mL Intravenous Continuous Doran Stabler, MD      .  vedolizumab (ENTYVIO) 300 mg in sodium chloride 0.9 % 250 mL infusion  300 mg Intravenous Once Doran Stabler, MD        No Known Allergies  Family History  Problem Relation Age of Onset  . Healthy Mother   . Diabetes Father   . Hypertension Maternal Grandmother   . Colon cancer Neg Hx   . Stomach cancer Neg Hx   . Rectal cancer Neg Hx   . Esophageal cancer Neg Hx   . Liver cancer Neg Hx     Social History   Socioeconomic History  . Marital status: Single    Spouse name: Not on file  . Number of  children: 5  . Years of education: Not on file  . Highest education level: Not on file  Occupational History  . Occupation: disabled  Social Needs  . Financial resource strain: Not on file  . Food insecurity:    Worry: Not on file    Inability: Not on file  . Transportation needs:    Medical: Not on file    Non-medical: Not on file  Tobacco Use  . Smoking status: Current Every Day Smoker    Packs/day: 0.50    Years: 20.00    Pack years: 10.00    Types: Cigarettes  . Smokeless tobacco: Never Used  . Tobacco comment: tobacco info given  Substance and Sexual Activity  . Alcohol use: Yes    Alcohol/week: 1.8 - 2.4 oz    Types: 3 - 4 Cans of beer per week    Comment: daily  . Drug use: No    Types: Marijuana    Comment: quit feb 2018  . Sexual activity: Not on file  Lifestyle  . Physical activity:    Days per week: Not on file    Minutes per session: Not on file  . Stress: Not on file  Relationships  . Social connections:    Talks on phone: Not on file    Gets together: Not on file    Attends religious service: Not on file    Active member of club or organization: Not on file    Attends meetings of clubs or organizations: Not on file    Relationship status: Not on file  . Intimate partner violence:    Fear of current or ex partner: Not on file    Emotionally abused: Not on file    Physically abused: Not on file    Forced sexual activity: Not on file  Other Topics Concern  . Not on file  Social History Narrative  . Not on file    ROS General: Denies fever, chills, night sweats, changes in weight, changes in appetite HEENT: Denies headaches, ear pain, changes in vision, rhinorrhea, sore throat CV: Denies palpitations, SOB, orthopnea  +CP Pulm: Denies SOB, cough, wheezing GI: Denies abdominal pain, nausea, vomiting, diarrhea, constipation  +h/o crohn's dz GU: Denies dysuria, hematuria, frequency, vaginal discharge Msk: Denies muscle cramps, joint pains Neuro:  Denies weakness, numbness, tingling Skin: Denies rashes, bruising Psych: Denies depression, anxiety, hallucinations  BP (!) 144/62 (BP Location: Left Arm, Patient Position: Sitting, Cuff Size: Normal)   Pulse 78   Temp 98.4 F (36.9 C) (Oral)   Ht 6' 0.96" (1.853 m)   Wt 195 lb (88.5 kg)   SpO2 98%   BMI 25.76 kg/m   Physical Exam Gen. Pleasant, well developed, well-nourished, in NAD HEENT - [place]/AT, PERRL, no scleral icterus, no nasal drainage, pharynx without  erythema or exudate.  TMs normal bilaterally.  No cervical lymphadenopathy. Lungs: no use of accessory muscles, CTAB, no wheezes, rales or rhonchi Cardiovascular: RRR, No r/g/m, no peripheral edema Abdomen: BS present, soft, nontender, nondistended, no hepatosplenomegaly Neuro:  A&Ox3, CN II-XII intact, normal gait Psych: normal affect, mood appropriate   Recent Results (from the past 2160 hour(s))  B12 and Folate Panel     Status: Abnormal   Collection Time: 08/20/17  2:10 PM  Result Value Ref Range   Vitamin B-12 165 (L) 211 - 911 pg/mL   Folate 13.5 >5.9 ng/mL  Ferritin     Status: Abnormal   Collection Time: 08/20/17  2:10 PM  Result Value Ref Range   Ferritin 8.5 (L) 22.0 - 322.0 ng/mL  IBC panel     Status: Abnormal   Collection Time: 08/20/17  2:10 PM  Result Value Ref Range   Iron 19 (L) 42 - 165 ug/dL   Transferrin 321.0 212.0 - 360.0 mg/dL   Saturation Ratios 4.2 (L) 20.0 - 50.0 %  Sed Rate (ESR)     Status: Abnormal   Collection Time: 08/20/17  2:10 PM  Result Value Ref Range   Sed Rate 130 Repeated and verified X2. (H) 0 - 15 mm/hr  Comp Met (CMET)     Status: Abnormal   Collection Time: 08/20/17  2:10 PM  Result Value Ref Range   Sodium 136 135 - 145 mEq/L   Potassium 3.8 3.5 - 5.1 mEq/L   Chloride 106 96 - 112 mEq/L   CO2 22 19 - 32 mEq/L   Glucose, Bld 102 (H) 70 - 99 mg/dL   BUN 15 6 - 23 mg/dL   Creatinine, Ser 1.42 0.40 - 1.50 mg/dL   Total Bilirubin 0.4 0.2 - 1.2 mg/dL   Alkaline  Phosphatase 75 39 - 117 U/L   AST 20 0 - 37 U/L   ALT 12 0 - 53 U/L   Total Protein 7.9 6.0 - 8.3 g/dL   Albumin 3.6 3.5 - 5.2 g/dL   Calcium 9.0 8.4 - 10.5 mg/dL   GFR 67.94 >60.00 mL/min  CBC w/Diff     Status: Abnormal   Collection Time: 08/20/17  2:10 PM  Result Value Ref Range   WBC 6.3 4.0 - 10.5 K/uL   RBC 3.02 (L) 4.22 - 5.81 Mil/uL   Hemoglobin 8.0 Repeated and verified X2. (LL) 13.0 - 17.0 g/dL   HCT 25.4 (L) 39.0 - 52.0 %   MCV 83.9 78.0 - 100.0 fl   MCHC 31.5 30.0 - 36.0 g/dL   RDW 24.5 (H) 11.5 - 15.5 %   Platelets 429.0 (H) 150.0 - 400.0 K/uL   Neutrophils Relative % 60.4 43.0 - 77.0 %   Lymphocytes Relative 29.3 12.0 - 46.0 %   Monocytes Relative 7.6 3.0 - 12.0 %   Eosinophils Relative 1.9 0.0 - 5.0 %   Basophils Relative 0.8 0.0 - 3.0 %  Pathologist smear review     Status: None   Collection Time: 08/20/17  3:53 PM  Result Value Ref Range   Path Review      Comment: Myeloid population consists predominantly of mature segmented neutrophils. No immature cells are identified. Anemia with RBCs which appear to be borderline microcytic and hypochromic on smear review. Suggest evaluation for iron deficiency, if clinically indicated. The overall findings in this smear are consistent with a reactive thrombocytosis. Clinical correlation is recommended. Reviewed by Francis Gaines Rockne Coons, MD  Paramedic on File)  08/21/2017     Assessment/Plan:  Chest pain, unspecified type  -Discussed possible causes -EKG obtained this visit, sinus rhythm, ventricular rate upper 60s.  No previous study for comparison. -Given ongoing chest pain with exertion will refer to cardiology for stress test - Plan: EKG 12-Lead, Ambulatory referral to Cardiology -Patient given RTC or ED precautions for continued or worsening symptoms. -will need lipid panel when fasting---order placed.  Essential hypertension  -mildly elevated.  Per chart review, elevated at previous  visits -discussed checking bp at home -discussed lifestyle modifications (increasing physical activity, increasing p.o. intake of water, decreasing sodium intake) - Plan: amLODipine (NORVASC) 2.5 MG tablet  Crohn's disease of small intestine without complication (HCC) -Continue Entyvio infusions, B12, and Iron -continue f/u with Dr. Sharla Kidney, GI -pt encouraged to reconsider pain management   Encounter to establish care -We reviewed the PMH, PSH, FH, SH, Meds and Allergies. -We provided refills for any medications we will prescribe as needed. -We addressed current concerns per orders and patient instructions. -We have asked for records for pertinent exams, studies, vaccines and notes from previous providers. -We have advised patient to follow up per instructions below.  F/u in the next few wks for bp recheck.  Grier Mitts, MD

## 2017-08-23 NOTE — Patient Instructions (Signed)
Managing Your Hypertension Hypertension is commonly called high blood pressure. This is when the force of your blood pressing against the walls of your arteries is too strong. Arteries are blood vessels that carry blood from your heart throughout your body. Hypertension forces the heart to work harder to pump blood, and may cause the arteries to become narrow or stiff. Having untreated or uncontrolled hypertension can cause heart attack, stroke, kidney disease, and other problems. What are blood pressure readings? A blood pressure reading consists of a higher number over a lower number. Ideally, your blood pressure should be below 120/80. The first ("top") number is called the systolic pressure. It is a measure of the pressure in your arteries as your heart beats. The second ("bottom") number is called the diastolic pressure. It is a measure of the pressure in your arteries as the heart relaxes. What does my blood pressure reading mean? Blood pressure is classified into four stages. Based on your blood pressure reading, your health care provider may use the following stages to determine what type of treatment you need, if any. Systolic pressure and diastolic pressure are measured in a unit called mm Hg. Normal  Systolic pressure: below 177.  Diastolic pressure: below 80. Elevated  Systolic pressure: 939-030.  Diastolic pressure: below 80. Hypertension stage 1  Systolic pressure: 092-330.  Diastolic pressure: 07-62. Hypertension stage 2  Systolic pressure: 263 or above.  Diastolic pressure: 90 or above. What health risks are associated with hypertension? Managing your hypertension is an important responsibility. Uncontrolled hypertension can lead to:  A heart attack.  A stroke.  A weakened blood vessel (aneurysm).  Heart failure.  Kidney damage.  Eye damage.  Metabolic syndrome.  Memory and concentration problems.  What changes can I make to manage my  hypertension? Hypertension can be managed by making lifestyle changes and possibly by taking medicines. Your health care provider will help you make a plan to bring your blood pressure within a normal range. Eating and drinking  Eat a diet that is high in fiber and potassium, and low in salt (sodium), added sugar, and fat. An example eating plan is called the DASH (Dietary Approaches to Stop Hypertension) diet. To eat this way: ? Eat plenty of fresh fruits and vegetables. Try to fill half of your plate at each meal with fruits and vegetables. ? Eat whole grains, such as whole wheat pasta, brown rice, or whole grain bread. Fill about one quarter of your plate with whole grains. ? Eat low-fat diary products. ? Avoid fatty cuts of meat, processed or cured meats, and poultry with skin. Fill about one quarter of your plate with lean proteins such as fish, chicken without skin, beans, eggs, and tofu. ? Avoid premade and processed foods. These tend to be higher in sodium, added sugar, and fat.  Reduce your daily sodium intake. Most people with hypertension should eat less than 1,500 mg of sodium a day.  Limit alcohol intake to no more than 1 drink a day for nonpregnant women and 2 drinks a day for men. One drink equals 12 oz of beer, 5 oz of wine, or 1 oz of hard liquor. Lifestyle  Work with your health care provider to maintain a healthy body weight, or to lose weight. Ask what an ideal weight is for you.  Get at least 30 minutes of exercise that causes your heart to beat faster (aerobic exercise) most days of the week. Activities may include walking, swimming, or biking.  Include exercise  to strengthen your muscles (resistance exercise), such as weight lifting, as part of your weekly exercise routine. Try to do these types of exercises for 30 minutes at least 3 days a week.  Do not use any products that contain nicotine or tobacco, such as cigarettes and e-cigarettes. If you need help quitting, ask  your health care provider.  Control any long-term (chronic) conditions you have, such as high cholesterol or diabetes. Monitoring  Monitor your blood pressure at home as told by your health care provider. Your personal target blood pressure may vary depending on your medical conditions, your age, and other factors.  Have your blood pressure checked regularly, as often as told by your health care provider. Working with your health care provider  Review all the medicines you take with your health care provider because there may be side effects or interactions.  Talk with your health care provider about your diet, exercise habits, and other lifestyle factors that may be contributing to hypertension.  Visit your health care provider regularly. Your health care provider can help you create and adjust your plan for managing hypertension. Will I need medicine to control my blood pressure? Your health care provider may prescribe medicine if lifestyle changes are not enough to get your blood pressure under control, and if:  Your systolic blood pressure is 130 or higher.  Your diastolic blood pressure is 80 or higher.  Take medicines only as told by your health care provider. Follow the directions carefully. Blood pressure medicines must be taken as prescribed. The medicine does not work as well when you skip doses. Skipping doses also puts you at risk for problems. Contact a health care provider if:  You think you are having a reaction to medicines you have taken.  You have repeated (recurrent) headaches.  You feel dizzy.  You have swelling in your ankles.  You have trouble with your vision. Get help right away if:  You develop a severe headache or confusion.  You have unusual weakness or numbness, or you feel faint.  You have severe pain in your chest or abdomen.  You vomit repeatedly.  You have trouble breathing. Summary  Hypertension is when the force of blood pumping through  your arteries is too strong. If this condition is not controlled, it may put you at risk for serious complications.  Your personal target blood pressure may vary depending on your medical conditions, your age, and other factors. For most people, a normal blood pressure is less than 120/80.  Hypertension is managed by lifestyle changes, medicines, or both. Lifestyle changes include weight loss, eating a healthy, low-sodium diet, exercising more, and limiting alcohol. This information is not intended to replace advice given to you by your health care provider. Make sure you discuss any questions you have with your health care provider. Document Released: 01/31/2012 Document Revised: 04/05/2016 Document Reviewed: 04/05/2016 Elsevier Interactive Patient Education  2018 [employer].  Angina Pectoris Angina pectoris is a very bad feeling in the chest, neck, or arm. Your doctor may call it angina. There are four types of angina. Angina is caused by a lack of blood in the middle and thickest layer of the heart wall (myocardium). Angina may feel like a crushing or squeezing pain in the chest. It may feel like tightness or heavy pressure in the chest. Some people say it feels like gas, heartburn, or indigestion. Some people have symptoms other than pain. These include:  Shortness of breath.  Cold sweats.  Feeling  sick to your stomach (nausea).  Feeling light-headed.  Many women have chest discomfort and some of the other symptoms. However, women often have different symptoms, such as:  Feeling tired (fatigue).  Feeling nervous for no reason.  Feeling weak for no reason.  Dizziness or fainting.  Women may have angina without any symptoms. Follow these instructions at home:  Take medicines only as told by your doctor.  Take care of other health issues as told by your doctor. These include: ? High blood pressure (hypertension). ? Diabetes.  Follow a heart-healthy diet. Your doctor can  help you to choose healthy food options and make changes.  Talk to your doctor to learn more about healthy cooking methods and use them. These include: ? Roasting. ? Grilling. ? Broiling. ? Baking. ? Poaching. ? Steaming. ? Stir-frying.  Follow an exercise program approved by your doctor.  Keep a healthy weight. Lose weight as told by your doctor.  Rest when you are tired.  Learn to manage stress.  Do not use any tobacco, such as cigarettes, chewing tobacco, or electronic cigarettes. If you need help quitting, ask your doctor.  If you drink alcohol, and your doctor says it is okay, limit yourself to no more than 1 drink per day. One drink equals 12 ounces of beer, 5 ounces of wine, or 1 ounces of hard liquor.  Stop illegal drug use.  Keep all follow-up visits as told by your doctor. This is important. Do not take these medicines unless your doctor says that you can:  Nonsteroidal anti-inflammatory drugs (NSAIDs). These include: ? Ibuprofen. ? Naproxen. ? Celecoxib.  Vitamin supplements that have vitamin A, vitamin E, or both.  Hormone therapy that contains estrogen with or without progestin.  Get help right away if:  You have pain in your chest, neck, arm, jaw, stomach, or back that: ? Lasts more than a few minutes. ? Comes back. ? Does not get better after you take medicine under your tongue (sublingual nitroglycerin).  You have any of these symptoms for no reason: ? Gas, heartburn, or indigestion. ? Sweating a lot. ? Shortness of breath or trouble breathing. ? Feeling sick to your stomach or throwing up. ? Feeling more tired than usual. ? Feeling nervous or worrying more than usual. ? Feeling weak. ? Diarrhea.  You are suddenly dizzy or light-headed.  You faint or pass out. These symptoms may be an emergency. Do not wait to see if the symptoms will go away. Get medical help right away. Call your local emergency services (911 in the [place] Do not drive  yourself to the hospital. This information is not intended to replace advice given to you by your health care provider. Make sure you discuss any questions you have with your health care provider. Document Released: 10/25/2007 Document Revised: 10/14/2015 Document Reviewed: 09/09/2013 Elsevier Interactive Patient Education  2017 [employer].

## 2017-08-27 ENCOUNTER — Ambulatory Visit (HOSPITAL_COMMUNITY): Admission: RE | Admit: 2017-08-27 | Payer: Medicare Other | Source: Ambulatory Visit

## 2017-08-29 ENCOUNTER — Inpatient Hospital Stay (HOSPITAL_COMMUNITY): Admission: RE | Admit: 2017-08-29 | Payer: Medicare Other | Source: Ambulatory Visit

## 2017-08-31 ENCOUNTER — Inpatient Hospital Stay: Admission: RE | Admit: 2017-08-31 | Payer: Medicare Other | Source: Ambulatory Visit

## 2017-09-03 ENCOUNTER — Other Ambulatory Visit (HOSPITAL_COMMUNITY): Payer: Self-pay | Admitting: *Deleted

## 2017-09-04 ENCOUNTER — Ambulatory Visit (HOSPITAL_COMMUNITY): Admission: RE | Admit: 2017-09-04 | Payer: Medicare Other | Source: Ambulatory Visit

## 2017-09-04 ENCOUNTER — Encounter (HOSPITAL_COMMUNITY): Payer: Medicare Other

## 2017-09-05 ENCOUNTER — Encounter (HOSPITAL_COMMUNITY): Payer: Medicare Other

## 2017-09-10 ENCOUNTER — Ambulatory Visit (INDEPENDENT_AMBULATORY_CARE_PROVIDER_SITE_OTHER)
Admission: RE | Admit: 2017-09-10 | Discharge: 2017-09-10 | Disposition: A | Discharge status: Home / Self Care | Payer: Medicare Other | Source: Ambulatory Visit | Attending: Gastroenterology | Admitting: Gastroenterology

## 2017-09-10 DIAGNOSIS — K5000 Crohn's disease of small intestine without complications: Secondary | ICD-10-CM

## 2017-09-10 DIAGNOSIS — D500 Iron deficiency anemia secondary to blood loss (chronic): Secondary | ICD-10-CM | POA: Diagnosis not present

## 2017-09-10 DIAGNOSIS — N200 Calculus of kidney: Secondary | ICD-10-CM | POA: Diagnosis not present

## 2017-09-10 MED ORDER — IOPAMIDOL (ISOVUE-300) INJECTION 61%
100.0000 mL | Freq: Once | INTRAVENOUS | Status: AC | PRN
  Administered 2017-09-10: 100 mL via INTRAVENOUS

## 2017-09-11 ENCOUNTER — Other Ambulatory Visit (HOSPITAL_COMMUNITY): Payer: Self-pay | Admitting: *Deleted

## 2017-09-11 ENCOUNTER — Encounter (HOSPITAL_COMMUNITY): Payer: Medicare Other

## 2017-09-12 ENCOUNTER — Ambulatory Visit (HOSPITAL_COMMUNITY)
Admission: RE | Admit: 2017-09-12 | Discharge: 2017-09-12 | Disposition: A | Discharge status: Home / Self Care | Payer: Medicare Other | Source: Ambulatory Visit | Attending: Gastroenterology | Admitting: Gastroenterology

## 2017-09-12 ENCOUNTER — Telehealth: Payer: Self-pay

## 2017-09-12 DIAGNOSIS — D500 Iron deficiency anemia secondary to blood loss (chronic): Secondary | ICD-10-CM | POA: Diagnosis not present

## 2017-09-12 MED ORDER — SODIUM CHLORIDE 0.9 % IV SOLN
510.0000 mg | INTRAVENOUS | Status: DC
  Administered 2017-09-12: 10:00:00 510 mg via INTRAVENOUS
  Filled 2017-09-12: qty 17

## 2017-09-12 NOTE — Telephone Encounter (Signed)
Spoke to [place] from [facility], patient is there now getting 1st of his Feraheme infusions. Patient was asking about B12 injection, let her know to remind him that we were asking that his new PCP take over that aspect of his care and he was to have discussed this with them at visit on 08/23/17. We are waiting on CT results to decide about Entyvio infusion. Once Dr. Loletha Carrow has had a chance to review them, we will contact patient.

## 2017-09-12 NOTE — Discharge Instructions (Signed)

## 2017-09-17 ENCOUNTER — Other Ambulatory Visit: Payer: Self-pay

## 2017-09-17 DIAGNOSIS — K5000 Crohn's disease of small intestine without complications: Secondary | ICD-10-CM

## 2017-09-20 ENCOUNTER — Ambulatory Visit (HOSPITAL_COMMUNITY)
Admission: RE | Admit: 2017-09-20 | Discharge: 2017-09-20 | Disposition: A | Discharge status: Home / Self Care | Payer: Medicare Other | Source: Ambulatory Visit | Attending: Gastroenterology | Admitting: Gastroenterology

## 2017-09-20 DIAGNOSIS — D500 Iron deficiency anemia secondary to blood loss (chronic): Secondary | ICD-10-CM | POA: Insufficient documentation

## 2017-09-20 MED ORDER — SODIUM CHLORIDE 0.9 % IV SOLN
510.0000 mg | INTRAVENOUS | Status: AC
  Administered 2017-09-20: 510 mg via INTRAVENOUS
  Filled 2017-09-20: qty 17

## 2017-09-25 ENCOUNTER — Ambulatory Visit (HOSPITAL_COMMUNITY)
Admission: RE | Admit: 2017-09-25 | Discharge: 2017-09-25 | Disposition: A | Discharge status: Home / Self Care | Payer: Medicare Other | Source: Ambulatory Visit | Attending: Gastroenterology | Admitting: Gastroenterology

## 2017-09-25 DIAGNOSIS — K5000 Crohn's disease of small intestine without complications: Secondary | ICD-10-CM

## 2017-09-25 MED ORDER — VEDOLIZUMAB 300 MG IV SOLR
300.0000 mg | Freq: Once | INTRAVENOUS | Status: AC
  Administered 2017-09-25: 300 mg via INTRAVENOUS
  Filled 2017-09-25: qty 5

## 2017-10-11 ENCOUNTER — Encounter: Payer: Self-pay | Admitting: Internal Medicine

## 2017-10-11 ENCOUNTER — Ambulatory Visit (INDEPENDENT_AMBULATORY_CARE_PROVIDER_SITE_OTHER): Payer: Medicare Other | Admitting: Internal Medicine

## 2017-10-11 VITALS — BP 150/76 | HR 67 | Ht 74.0 in | Wt 187.8 lb

## 2017-10-11 DIAGNOSIS — K50119 Crohn's disease of large intestine with unspecified complications: Secondary | ICD-10-CM | POA: Diagnosis not present

## 2017-10-11 DIAGNOSIS — Z13220 Encounter for screening for lipoid disorders: Secondary | ICD-10-CM | POA: Diagnosis not present

## 2017-10-11 DIAGNOSIS — R079 Chest pain, unspecified: Secondary | ICD-10-CM | POA: Diagnosis not present

## 2017-10-11 NOTE — Patient Instructions (Addendum)
Your physician recommends that you return for lab work today.   Your physician has requested that you have an exercise tolerance test. For further information please visit [email or web address]. Please also follow instruction sheet, as given.  Your physician recommends that you schedule a follow-up appointment after your stress test

## 2017-10-11 NOTE — Progress Notes (Signed)
OFFICE CONSULT NOTE  Chief Complaint:  Chest pain  Primary Care Physician: Julian Ruddy, MD  HPI:  Julian Garner is a 52 y.o. male who is being seen today for the evaluation of chest pain at the request of Julian Ruddy, MD.  This is a pleasant 52 year old male patient kindly referred for evaluation of chest pain.  He had reported to his primary care provider in the beginning of April about chest pain which is been on and off for a while.  He is noted it to be worse with exertion and improved at rest.  Pain is somewhat sharp and associated with increased heart rate.  There is some associated dizziness no significant shortness of breath.  The pain does not radiate.  He describes it as an 8-9 out of 10 in intensity.  He was also noted to be hypertensive.  He says his blood pressures been up and down recently.  In addition his past medical history significant for Crohn's colitis.  Recent blood work shows significant anemia suggesting active Crohn's disease.  He is undergoing therapy and is being seen by Dr. Loletha Garner.  There is no significant family history of heart disease although hypertension runs in his family.  EKG today shows sinus rhythm without ischemic changes.  His blood pressure is elevated, however he said he got lost with his GPS coming to the office.  PMHx:  Past Medical History:  Diagnosis Date  . Anemia   . Crohn's colitis ([place])   . Hyperlipidemia     Past Surgical History:  Procedure Laterality Date  . COLOSTOMY    . ILEOSTOMY      FAMHx:  Family History  Problem Relation Age of Onset  . Healthy Mother   . Diabetes Father   . Hypertension Maternal Grandmother   . Colon cancer Neg Hx   . Stomach cancer Neg Hx   . Rectal cancer Neg Hx   . Esophageal cancer Neg Hx   . Liver cancer Neg Hx     SOCHx:   reports that he has been smoking cigarettes.  He has been smoking about 0.50 packs per day for the past 0.00 years. He has never used smokeless tobacco. He  reports that he drinks about 1.8 - 2.4 oz of alcohol per week. He reports that he does not use drugs.  ALLERGIES:  No Known Allergies  ROS: Pertinent items noted in HPI and remainder of comprehensive ROS otherwise negative.  HOME MEDS: Current Outpatient Medications on File Prior to Visit  Medication Sig Dispense Refill  . amLODipine (NORVASC) 2.5 MG tablet Take 1 tablet (2.5 mg total) by mouth daily. 30 tablet 2  . cyanocobalamin (,VITAMIN B-12,) 1000 MCG/ML injection Inject 1 mL (1,000 mcg total) into the muscle every 30 (thirty) days. 10 mL 0  . ferrous sulfate (FERROUSUL) 325 (65 FE) MG tablet Take 1 tablet (325 mg total) by mouth 2 (two) times daily with a meal. 90 tablet 0  . triamcinolone cream (KENALOG) 0.1 % Apply 1 application topically 2 (two) times daily. 30 g 0   Current Facility-Administered Medications on File Prior to Visit  Medication Dose Route Frequency Provider Last Rate Last Dose  . 0.9 %  sodium chloride infusion  500 mL Intravenous Continuous Danis, Estill Cotta III, MD      . vedolizumab (ENTYVIO) 300 mg in sodium chloride 0.9 % 250 mL infusion  300 mg Intravenous Once Julian Stabler, MD  LABS/IMAGING: No results found for this or any previous visit (from the past 48 hour(s)). No results found.  LIPID PANEL: No results found for: CHOL, TRIG, HDL, CHOLHDL, VLDL, LDLCALC, LDLDIRECT  WEIGHTS: Wt Readings from Last 3 Encounters:  10/11/17 187 lb 12.8 oz (85.2 kg)  09/25/17 195 lb (88.5 kg)  09/20/17 195 lb (88.5 kg)    VITALS: BP (!) 150/76   Pulse 67   Ht 6' 2"  (1.88 m)   Wt 187 lb 12.8 oz (85.2 kg)   BMI 24.11 kg/m   EXAM: General appearance: alert and no distress Neck: no carotid bruit, no JVD and thyroid not enlarged, symmetric, no tenderness/mass/nodules Lungs: clear to auscultation bilaterally Heart: regular rate and rhythm Abdomen: soft, non-tender; bowel sounds normal; no masses,  no organomegaly Extremities: extremities normal,  atraumatic, no cyanosis or edema Pulses: 2+ and symmetric Skin: Skin color, texture, turgor normal. No rashes or lesions Neurologic: Grossly normal Psych: Pleasant  EKG: Sinus rhythm at 67- personally reviewed  ASSESSMENT: 1. Chest pain with atypical and typical features 2. Crohn's colitis-active disease 3. Possible symptomatic anemia 4. Uncontrolled hypertension  PLAN: 1.   [name] had chest pain with some typical and atypical features, at the time however is noted to be anemic with hemoglobin around 8, suggesting active Crohn's disease.  He is followed by Dr. Loletha Garner and is on treatment.  His EKG is normal.  His blood pressure was also elevated which could have contributed to chest discomfort.  Its improved today however remains above goal.  He may need up titration of his medications.  He was ordered to have a lipid profile which is not been drawn.  We will go ahead and have him do blood work today since he is fasting.  I will order a treadmill exercise stress test.  Plan follow-up with me afterwards to review his blood work, stress test results and recheck his blood pressure.  We may need to increase his medicine at that time.  Thanks again for the kind referral.  Julian Casino, MD, FACC, [place] Director of the [facility] Diplomate of the American Board of Clinical Lipidology Attending Cardiologist  Direct Dial: [phone]  Fax: [phone]  Website:  [email or web address] 10/11/2017, 12:36 PM

## 2017-10-12 LAB — LIPID PANEL
Chol/HDL Ratio: 1.7 ratio (ref 0.0–5.0)
Cholesterol, Total: 161 mg/dL (ref 100–199)
HDL: 96 mg/dL (ref >39)
LDL Calculated: 49 mg/dL (ref 0–99)
TRIGLYCERIDES: 81 mg/dL (ref 0–149)
VLDL Cholesterol Cal: 16 mg/dL (ref 5–40)

## 2017-10-13 ENCOUNTER — Telehealth: Payer: Self-pay | Admitting: Gastroenterology

## 2017-10-13 NOTE — Telephone Encounter (Signed)
[name],  I reviewed a note from consulting cardiologist for this patient.    In early April his B12 level and iron were low. After canceling a few feraheme injections, I believe he finally had one late April.  I also requested be get a B12 injection at primary care office.  He apparently has a supply of B12 at home. I see your phone note from 4/24 about this.  As near as can tell, Julian Garner still has not had B12.  So he is most likely still anemic and that may be contributing to his chest pain.  Please try to coordinate him having 1000 micrograms B12 IM once weekly x 4 injections, then 1000 micrograms monthly.    He will need CBC in about 4 weeks so we can plan his colonoscopy (assuming cardiac stress test shows no ischemia.  Dr. Volanda Napoleon,    Please ask your clinic staff to reach out to [name] about this.  I am afraid extra efforts are usually required since he does not always follow through with recommendations.

## 2017-10-16 ENCOUNTER — Other Ambulatory Visit: Payer: Self-pay

## 2017-10-16 ENCOUNTER — Encounter: Payer: Self-pay | Admitting: Internal Medicine

## 2017-10-16 ENCOUNTER — Telehealth: Payer: Self-pay

## 2017-10-16 DIAGNOSIS — D500 Iron deficiency anemia secondary to blood loss (chronic): Secondary | ICD-10-CM

## 2017-10-16 DIAGNOSIS — K5000 Crohn's disease of small intestine without complications: Secondary | ICD-10-CM

## 2017-10-16 NOTE — Telephone Encounter (Signed)
Spoke to patient, asked him to contact his PCP office, gave him their phone number. He needs to contact them to arrange B12 injections. He is also aware to come to our office in one month for lab work. He understands that I have also put a letter in the mail with this information.

## 2017-10-16 NOTE — Telephone Encounter (Signed)
Left patient a voice message that I need for him to contact our office, I have also put in the mail a reminder about needing to contact his PCP about Vit B12 injections. Also reminding him we need to recheck his CBC in one month. Order in [place].

## 2017-10-19 ENCOUNTER — Telehealth (HOSPITAL_COMMUNITY): Payer: Self-pay

## 2017-10-19 NOTE — Telephone Encounter (Signed)
Encounter complete. 

## 2017-10-22 ENCOUNTER — Telehealth: Payer: Self-pay | Admitting: Family Medicine

## 2017-10-22 NOTE — Telephone Encounter (Signed)
Please advise 

## 2017-10-22 NOTE — Telephone Encounter (Signed)
Pt requesting to come in here for Vitamin B 12 injections. Looks like another provider ordered labs and results are in chart. I just need a order from Dr. Volanda Napoleon to give injections, if approved will need to call patient and schedule a nurse visit.

## 2017-10-23 ENCOUNTER — Telehealth (HOSPITAL_COMMUNITY): Payer: Self-pay | Admitting: *Deleted

## 2017-10-23 NOTE — Telephone Encounter (Signed)
Unable to leave message on voicemail in reference to upcoming appointment scheduled for 10/24/17.  Merion Caton, Ranae Palms

## 2017-10-23 NOTE — Telephone Encounter (Signed)
That's fine

## 2017-10-24 ENCOUNTER — Ambulatory Visit (HOSPITAL_COMMUNITY)
Admission: RE | Admit: 2017-10-24 | Payer: Medicare Other | Source: Ambulatory Visit | Attending: Internal Medicine | Admitting: Internal Medicine

## 2017-10-24 NOTE — Telephone Encounter (Signed)
Patient states he needs to speak to [facility] regarding B12 injections.

## 2017-10-25 NOTE — Telephone Encounter (Signed)
Called patient back, had to lvm. Looks like from chart documentation his PCP has okay'd him to receive injections at their office. Instructed patient to call them to set this up. If he has further questions to call our office.

## 2017-10-25 NOTE — Telephone Encounter (Signed)
Attempted to call patient. No answer, no voicemail. Will attempt call back tomorrow.

## 2017-10-26 ENCOUNTER — Telehealth (HOSPITAL_COMMUNITY): Payer: Self-pay

## 2017-10-26 NOTE — Telephone Encounter (Signed)
Attempted to call patient. No answer, no voicemail. Will attempt call back.

## 2017-10-26 NOTE — Telephone Encounter (Signed)
Encounter complete. 

## 2017-10-29 ENCOUNTER — Ambulatory Visit (INDEPENDENT_AMBULATORY_CARE_PROVIDER_SITE_OTHER): Payer: Medicare Other | Admitting: *Deleted

## 2017-10-29 DIAGNOSIS — E538 Deficiency of other specified B group vitamins: Secondary | ICD-10-CM | POA: Diagnosis not present

## 2017-10-29 MED ORDER — CYANOCOBALAMIN 1000 MCG/ML IJ SOLN
1000.0000 ug | Freq: Once | INTRAMUSCULAR | Status: AC
  Administered 2017-10-29: 1000 ug via INTRAMUSCULAR

## 2017-10-29 NOTE — Progress Notes (Signed)
Patient originally prescribed B12 injection per Dr. Loletha Carrow at [facility] for 1000 mcg weekly x4 weeks then monthly (see 10/13/17 phone note) but Dr. Volanda Garner has agreed to take over these injections for him. Unclear if patient received any B12 injections from their office prior to today. Called [facility] and spoke w/ Jena Gauss, RN who confirmed that patient has not received any weekly injections from them and today will be first injection.   Per orders of Dr. Volanda Garner, injection of B12 given by Julian Garner. Patient tolerated injection well. He will return in 1 week for second injection.

## 2017-10-29 NOTE — Telephone Encounter (Signed)
Attempted to call patient. No answer, no voicemail.

## 2017-10-30 ENCOUNTER — Telehealth (HOSPITAL_COMMUNITY): Payer: Self-pay

## 2017-10-30 NOTE — Telephone Encounter (Signed)
Encounter complete. 

## 2017-10-31 ENCOUNTER — Ambulatory Visit (HOSPITAL_COMMUNITY)
Admission: RE | Admit: 2017-10-31 | Discharge: 2017-10-31 | Disposition: A | Discharge status: Home / Self Care | Payer: Medicare Other | Source: Ambulatory Visit | Attending: Cardiology | Admitting: Cardiology

## 2017-10-31 DIAGNOSIS — R079 Chest pain, unspecified: Secondary | ICD-10-CM | POA: Diagnosis not present

## 2017-10-31 LAB — EXERCISE TOLERANCE TEST
CHL RATE OF PERCEIVED EXERTION: 19
CSEPED: 6 min
CSEPHR: 80 %
Estimated workload: 8.2 METS
Exercise duration (sec): 47 s
MPHR: 171 {beats}/min
Peak HR: 137 {beats}/min
Rest HR: 64 {beats}/min

## 2017-11-05 ENCOUNTER — Ambulatory Visit (INDEPENDENT_AMBULATORY_CARE_PROVIDER_SITE_OTHER): Payer: Medicare Other | Admitting: *Deleted

## 2017-11-05 DIAGNOSIS — E538 Deficiency of other specified B group vitamins: Secondary | ICD-10-CM | POA: Diagnosis not present

## 2017-11-05 MED ORDER — CYANOCOBALAMIN 1000 MCG/ML IJ SOLN
1000.0000 ug | Freq: Once | INTRAMUSCULAR | Status: AC
  Administered 2017-11-05: 1000 ug via INTRAMUSCULAR

## 2017-11-05 NOTE — Progress Notes (Signed)
Per orders of Dr. Volanda Napoleon, injection of B12 given by Dorrene German. Patient tolerated injection well.

## 2017-11-12 ENCOUNTER — Ambulatory Visit (INDEPENDENT_AMBULATORY_CARE_PROVIDER_SITE_OTHER): Payer: Medicare Other | Admitting: *Deleted

## 2017-11-12 DIAGNOSIS — E538 Deficiency of other specified B group vitamins: Secondary | ICD-10-CM | POA: Diagnosis not present

## 2017-11-12 MED ORDER — CYANOCOBALAMIN 1000 MCG/ML IJ SOLN
1000.0000 ug | Freq: Once | INTRAMUSCULAR | Status: AC
  Administered 2017-11-12: 1000 ug via INTRAMUSCULAR

## 2017-11-12 NOTE — Progress Notes (Signed)
Per orders of Dr. Volanda Napoleon, injection of B12 given by Dorrene German. Patient tolerated injection well.

## 2017-11-19 ENCOUNTER — Ambulatory Visit: Payer: Medicare Other

## 2017-11-20 ENCOUNTER — Ambulatory Visit: Payer: Medicare Other

## 2017-11-26 ENCOUNTER — Other Ambulatory Visit: Payer: Self-pay | Admitting: Family Medicine

## 2017-11-26 DIAGNOSIS — I10 Essential (primary) hypertension: Secondary | ICD-10-CM

## 2017-11-27 ENCOUNTER — Ambulatory Visit (INDEPENDENT_AMBULATORY_CARE_PROVIDER_SITE_OTHER): Payer: Medicare Other | Admitting: *Deleted

## 2017-11-27 DIAGNOSIS — E538 Deficiency of other specified B group vitamins: Secondary | ICD-10-CM

## 2017-11-27 MED ORDER — CYANOCOBALAMIN 1000 MCG/ML IJ SOLN
1000.0000 ug | Freq: Once | INTRAMUSCULAR | Status: AC
  Administered 2017-11-27: 1000 ug via INTRAMUSCULAR

## 2017-11-27 NOTE — Progress Notes (Signed)
Per orders of Dr. Volanda Napoleon, injection of B12 given by Dorrene German. Patient tolerated injection well.

## 2017-12-04 ENCOUNTER — Telehealth: Payer: Self-pay | Admitting: Gastroenterology

## 2017-12-04 NOTE — Telephone Encounter (Signed)
Routed to DOD, patient of Dr. Loletha Carrow. Patient for last week has been having intermittent LLQ abdominal pain and rectal bleeding. States he's always got SOB, but is also dizzy. Denies chest pain. Patient had feraheme infusion at end of April / beginning of May, was supposed to come back in for a recheck CBC but has failed to show, reminded him that we need this test to recheck anemia. No APP appointments available for 2 weeks.

## 2017-12-04 NOTE — Telephone Encounter (Signed)
Patient advised of recommendations. There was an available appointment with an APP for 7/18 at 11:00, patient will come in then and also do blood work as he was having difficulty getting a ride.  He understands that if symptoms persist or worsen he needs to either contact PCP or go to ED for evaluation.  Patient also is applying for disability. Patient is requesting a copy of colonoscopy report and a letter from Dr. Loletha Carrow as to whether or not he can continue to work. Patient aware that Dr. Loletha Carrow is not in the office this week, but would send this message to him. I have also suggested he contact his PCP for such a letter.

## 2017-12-04 NOTE — Telephone Encounter (Signed)
Patient states he is still having abd pain and blood in stool and would like advice.

## 2017-12-04 NOTE — Telephone Encounter (Signed)
Office note from 3 months ago reviewed. Based on that review,I cannot provide over the telephone recommendations without seeing him (my schedule is overbooked) but do recommend next available office appointment with Dr. Loletha Carrow or advanced practitioner (next available). In the interim, if his problems persist or worsen he should present to his PCP or the emergency room for formal evaluation

## 2017-12-05 NOTE — Telephone Encounter (Signed)
I am out of the office this week and checking messages. Thank you for finding an office appointment for him- yes he is complex and should be seen for these symptoms rather than phone advice.  His variable compliance and social stressors have been a challenge. APP should run his case by the supervising physician on 7/18, so please route this to them. I wanted Julian Garner to have a colonoscopy to assess his Crohn's once Cardiology felt his cardiac workup ruled out ischemia.  Lastly, I am not in the practice of making determinations regarding disability.  This must be done by a primary care or, at their discretion, an occupational medicine physician.  Any necessary records related to his care are available to the patient through [place] or MyChart.

## 2017-12-06 ENCOUNTER — Encounter: Payer: Self-pay | Admitting: Family Medicine

## 2017-12-06 ENCOUNTER — Ambulatory Visit (INDEPENDENT_AMBULATORY_CARE_PROVIDER_SITE_OTHER): Payer: Medicare Other | Admitting: Physician Assistant

## 2017-12-06 ENCOUNTER — Encounter: Payer: Self-pay | Admitting: Physician Assistant

## 2017-12-06 ENCOUNTER — Other Ambulatory Visit (INDEPENDENT_AMBULATORY_CARE_PROVIDER_SITE_OTHER): Payer: Medicare Other

## 2017-12-06 ENCOUNTER — Ambulatory Visit (INDEPENDENT_AMBULATORY_CARE_PROVIDER_SITE_OTHER): Payer: Medicare Other | Admitting: Family Medicine

## 2017-12-06 VITALS — BP 110/58 | HR 104 | Temp 98.3°F | Wt 183.0 lb

## 2017-12-06 VITALS — BP 128/70 | HR 98 | Ht 74.0 in | Wt 182.0 lb

## 2017-12-06 DIAGNOSIS — I10 Essential (primary) hypertension: Secondary | ICD-10-CM | POA: Diagnosis not present

## 2017-12-06 DIAGNOSIS — R531 Weakness: Secondary | ICD-10-CM | POA: Diagnosis not present

## 2017-12-06 DIAGNOSIS — K50019 Crohn's disease of small intestine with unspecified complications: Secondary | ICD-10-CM

## 2017-12-06 DIAGNOSIS — R200 Anesthesia of skin: Secondary | ICD-10-CM

## 2017-12-06 DIAGNOSIS — R5383 Other fatigue: Secondary | ICD-10-CM | POA: Diagnosis not present

## 2017-12-06 DIAGNOSIS — D500 Iron deficiency anemia secondary to blood loss (chronic): Secondary | ICD-10-CM | POA: Diagnosis not present

## 2017-12-06 LAB — CBC WITH DIFFERENTIAL/PLATELET
BASOS PCT: 0.4 % (ref 0.0–3.0)
Basophils Absolute: 0 10*3/uL (ref 0.0–0.1)
EOS PCT: 2.2 % (ref 0.0–5.0)
Eosinophils Absolute: 0.1 10*3/uL (ref 0.0–0.7)
HCT: 36.7 % — ABNORMAL LOW (ref 39.0–52.0)
Hemoglobin: 12.3 g/dL — ABNORMAL LOW (ref 13.0–17.0)
LYMPHS ABS: 0.7 10*3/uL (ref 0.7–4.0)
Lymphocytes Relative: 13.9 % (ref 12.0–46.0)
MCHC: 33.6 g/dL (ref 30.0–36.0)
MCV: 100.2 fl — ABNORMAL HIGH (ref 78.0–100.0)
MONO ABS: 0.4 10*3/uL (ref 0.1–1.0)
MONOS PCT: 8.5 % (ref 3.0–12.0)
NEUTROS PCT: 75 % (ref 43.0–77.0)
Neutro Abs: 3.8 10*3/uL (ref 1.4–7.7)
Platelets: 284 10*3/uL (ref 150.0–400.0)
RBC: 3.67 Mil/uL — AB (ref 4.22–5.81)
RDW: 15.6 % — AB (ref 11.5–15.5)
WBC: 5.1 10*3/uL (ref 4.0–10.5)

## 2017-12-06 LAB — HIGH SENSITIVITY CRP: CRP, High Sensitivity: 2.17 mg/L (ref 0.000–5.000)

## 2017-12-06 LAB — FERRITIN: Ferritin: 23.2 ng/mL (ref 22.0–322.0)

## 2017-12-06 LAB — SEDIMENTATION RATE: SED RATE: 118 mm/h — AB (ref 0–20)

## 2017-12-06 NOTE — Progress Notes (Signed)
Subjective:    Patient ID: Julian Garner, male    DOB: 1965-05-29, 52 y.o.   MRN: [id number]  HPI Julian Garner is a 52 year old African-American male, known to Dr. Loletha Carrow with history of ileal Crohn's disease.  He is status post prior ileocolonic resection. Patient had been treated in the past with Remicade, but did not have good response to 10 mg/kg dosing.  He was switched to [facility] in the summer 2018.  He had undergone colonoscopy May 2018 with no evidence of rectal disease, the neoterminal ileum had multiple ulcers and mild luminal narrowing, anastomosis at the hepatic flexure patent. Patient has been somewhat noncompliant with his infusions and has missed a few.  He says he has been doing well over the past 4 to 5 months and getting all of his infusions. When he was last seen by Dr. Loletha Carrow in April 2019 he was complaining of chronic intermittent chest pain and exertional shortness of breath.  He has since been referred to cardiology and did have initial evaluation with Dr. Debara Pickett.  EKG was normal.  He was then scheduled for stress testing.  I think that evaluation was incomplete as he could not tolerate treadmill and procedure was aborted.  He has follow-up with Dr. Debara Pickett next week. And was to have him cleared by cardiology prior to proceeding with follow-up colonoscopy. He comes in today after he had called with complaints of intermittent rectal bleeding and abdominal pain. He says he has been more fatigued over the past couple of months continues to have shortness of breath with exertion but says he has not had any chest pain over the past month.  He had developed some tingling in his fingers and toes.  He has been seen by primary care.  Had labs showing a low B12 level and has started replacement. He continues to complain of abdominal pain, does not feel that it is any worse but does not feel he is had significant improvement on Entyvio.  Gets cramping which is sharp in nature off and on no  recent fever or chills.  Continues to have 4-5 or more loose bowel movements per day intermittently containing bright red blood or dark clotted appearing blood. Labs from April 2019 hemoglobin was 8/inadequate 25.4 MCV of 83.  Sed rate was 130 and he was iron deficient.  He has received Feraheme x2. He also mentions today that he has filed for disability and that the judge is specifically requested comment from gastroenterology regarding his Crohn's disease.  He will also give paperwork to primary care.   Review of Systems Pertinent positive and negative review of systems were noted in the above HPI section.  All other review of systems was otherwise negative.  Outpatient Encounter Medications as of 12/06/2017  Medication Sig  . amLODipine (NORVASC) 2.5 MG tablet TAKE 1 TABLET BY MOUTH EVERY DAY  . cyanocobalamin (,VITAMIN B-12,) 1000 MCG/ML injection Inject 1 mL (1,000 mcg total) into the muscle every 30 (thirty) days.  . ferrous sulfate (FERROUSUL) 325 (65 FE) MG tablet Take 1 tablet (325 mg total) by mouth 2 (two) times daily with a meal.  . triamcinolone cream (KENALOG) 0.1 % Apply 1 application topically 2 (two) times daily.  . Vedolizumab (ENTYVIO IV) Inject into the vein.   Facility-Administered Encounter Medications as of 12/06/2017  Medication  . vedolizumab (ENTYVIO) 300 mg in sodium chloride 0.9 % 250 mL infusion  . [DISCONTINUED] 0.9 %  sodium chloride infusion   No Known Allergies Patient  Active Problem List   Diagnosis Date Noted  . Chest pain 10/11/2017  . Screening for lipid disorders 10/11/2017  . Crohn's disease of colon with complication ([place]) 81/44/8185  . Crohn's disease ([place]) 08/23/2017   Social History   Socioeconomic History  . Marital status: Single    Spouse name: Not on file  . Number of children: 5  . Years of education: Not on file  . Highest education level: Not on file  Occupational History  . Occupation: disabled  Social Needs  . Financial resource  strain: Not on file  . Food insecurity:    Worry: Not on file    Inability: Not on file  . Transportation needs:    Medical: Not on file    Non-medical: Not on file  Tobacco Use  . Smoking status: Current Some Day Smoker    Packs/day: 0.50    Years: 0.00    Pack years: 0.00    Types: Cigarettes  . Smokeless tobacco: Never Used  . Tobacco comment: tobacco info given  Substance and Sexual Activity  . Alcohol use: Yes    Alcohol/week: 1.8 - 2.4 oz    Types: 3 - 4 Cans of beer per week    Comment: daily  . Drug use: No    Types: Marijuana    Comment: quit feb 2018  . Sexual activity: Not on file  Lifestyle  . Physical activity:    Days per week: Not on file    Minutes per session: Not on file  . Stress: Not on file  Relationships  . Social connections:    Talks on phone: Not on file    Gets together: Not on file    Attends religious service: Not on file    Active member of club or organization: Not on file    Attends meetings of clubs or organizations: Not on file    Relationship status: Not on file  . Intimate partner violence:    Fear of current or ex partner: Not on file    Emotionally abused: Not on file    Physically abused: Not on file    Forced sexual activity: Not on file  Other Topics Concern  . Not on file  Social History Narrative  . Not on file    Mr. Levi family history includes Diabetes in his father; Healthy in his mother; Hypertension in his maternal grandmother.      Objective:    Vitals:   12/06/17 1113  BP: 128/70  Pulse: 98    Physical Exam; well-developed African-American male in no acute distress, pleasant blood pressure 128/70 pulse 98, height 6 foot 2, weight 182, BMI 23.3.  HEENT; nontraumatic normocephalic EOMI PERRLA sclera anicteric oropharynx benign, Cardiovascular; regular rate and rhythm with S1-S2 no murmur rub or gallop, Pulmonary; clear bilaterally, Abdomen ;soft, bowel sounds are present he is tender in the right and left  mid and lower quadrant no palpable mass, some guarding no rebound, Rectal ;exam not done, Extremities; no clubbing cyanosis or edema skin warm dry, Neuro psych; alert and oriented, grossly nonfocal mood and affect appropriate       Assessment & Plan:   #54 52 year old African-American male with Crohn's ileitis status post prior ileocolectomy who has been managed with Entyvio over the past year.  Patient has had persistent active symptoms and I do not think has had good response at this point.  Previously treated with infliximab and nonresponsive.  #2 marked anemia with both iron deficiency  and B12 deficiency.  Patient received Feraheme x2 within the past couple of months and also has been started on B12 replacement  #3 Weakness and exertional dyspnea-likely multifactorial in part secondary to anemia #4 I ntermittent complaints of chest pain-patient denies chest pain over the past 1 month.  Cardiac work-up was initiated but patient could not complete exercise stress testing.  He has cardiology follow-up next week-rule out coronary artery disease  Plan; continue Entyvio 300 mg IV q. 8 weeks Check CBC with differential, BMET, sed rate, and ferritin We still need further cardiac evaluation for clearance prior to proceeding with follow-up colonoscopy.  Patient has appointment with cardiology next week and hopefully further imaging studies can be done in the near future.  We will go ahead and schedule him for colonoscopy with Dr. Raynelle Fanning intentionally schedule this towards the end of August to allow time to complete cardiac work-up, however he is not responding well to [facility] and need to move forward with colonoscopy, and probably switching Biologics.  Patient says he has not done well with steroids in the past and will hold for now.  Patient is in the process of applying for disability.   Genova Kiner S Naevia Unterreiner PA-C 12/06/2017   Cc: Billie Ruddy, MD

## 2017-12-06 NOTE — Progress Notes (Signed)
Subjective:    Patient ID: Julian Garner, male    DOB: [birth year], 52 y.o.   MRN: [id number]  No chief complaint on file.   HPI Patient was seen today for bp concern.  Pt states his blood pressure has been high over the last few weeks, 170/80.  Pt endorses numbness in fingers and toes, changes in vision.  Pt states he has been eating home cooked meals but add salt to them.  Pt's BP today in GI clinic was 128/70.  Pt drinks 6-7 bottles of water every few days.  Will drink juice and soda on the days he does not have water.  Past Medical History:  Diagnosis Date  . Anemia   . Crohn's colitis ([place])   . Hyperlipidemia     No Known Allergies  ROS General: Denies fever, chills, night sweats, changes in weight, changes in appetite HEENT: Denies headaches, ear pain, rhinorrhea, sore throat  +changes in vision CV: Denies CP, palpitations, SOB, orthopnea Pulm: Denies SOB, cough, wheezing GI: Denies abdominal pain, nausea, vomiting, diarrhea, constipation GU: Denies dysuria, hematuria, frequency, vaginal discharge Msk: Denies muscle cramps, joint pains Neuro: Denies weakness  +numbness, tingling in fingers and toes. Skin: Denies rashes, bruising Psych: Denies depression, anxiety, hallucinations     Objective:    Blood pressure (!) 110/58, pulse (!) 104, temperature 98.3 F (36.8 C), temperature source Oral, weight 183 lb (83 kg), SpO2 96 %.   Gen. Pleasant, well-nourished, in no distress, normal affect   HEENT: [place]/AT, face symmetric, no scleral icterus, PERRLA, nares patent without drainage Lungs: no accessory muscle use Cardiovascular: RRR Neuro:  A&Ox3, CN II-XII intact, normal gait   Wt Readings from Last 3 Encounters:  12/06/17 183 lb (83 kg)  12/06/17 182 lb (82.6 kg)  10/11/17 187 lb 12.8 oz (85.2 kg)    Lab Results  Component Value Date   WBC 5.1 12/06/2017   HGB 12.3 (L) 12/06/2017   HCT 36.7 (L) 12/06/2017   PLT 284.0 12/06/2017   GLUCOSE 102 (H) 08/20/2017   CHOL 161 10/11/2017   TRIG 81 10/11/2017   HDL 96 10/11/2017   LDLCALC 49 10/11/2017   ALT 12 08/20/2017   AST 20 08/20/2017   NA 136 08/20/2017   K 3.8 08/20/2017   CL 106 08/20/2017   CREATININE 1.42 08/20/2017   BUN 15 08/20/2017   CO2 22 08/20/2017    Assessment/Plan:  Numbness  -Discussed various causes -Given handout - Plan: Vitamin B12, Folate  Essential hypertension -Currently controlled, currently asymptomatic -Continue taking Norvasc 2.5 mg. -Discussed continuing to check BP at home. -Patient encouraged to bring in BP monitor next week for nurse's visit for comparison. -Patient encouraged to decrease sodium intake and increase p.o. intake of water -If elevated will increase Norvasc to 5 mg daily -Follow-up in 1 week for nurse's visit for BP recheck  Grier Mitts, MD

## 2017-12-06 NOTE — Patient Instructions (Addendum)
Your provider has requested that you go to the basement level for lab work before leaving today. Press "B" on the elevator. The lab is located at the first door on the left as you exit the elevator. Continue Entyvio. Continiue oral iron twice daily.   You have been scheduled for a colonoscopy. Please follow written instructions given to you at your visit today.  Please pick up your prep supplies at the pharmacy within the next 1-3 days.  We have given you a form to give to the pharmacist for the Plenvu prep kit.  If you use inhalers (even only as needed), please bring them with you on the day of your procedure.  If you are age 50 or younger, your body mass index should be between 19-25. Your Body mass index is 23.37 kg/m. If this is out of the aformentioned range listed, please consider follow up with your Primary Care Provider.

## 2017-12-06 NOTE — Patient Instructions (Signed)
Managing Your Hypertension Hypertension is commonly called high blood pressure. This is when the force of your blood pressing against the walls of your arteries is too strong. Arteries are blood vessels that carry blood from your heart throughout your body. Hypertension forces the heart to work harder to pump blood, and may cause the arteries to become narrow or stiff. Having untreated or uncontrolled hypertension can cause heart attack, stroke, kidney disease, and other problems. What are blood pressure readings? A blood pressure reading consists of a higher number over a lower number. Ideally, your blood pressure should be below 120/80. The first ("top") number is called the systolic pressure. It is a measure of the pressure in your arteries as your heart beats. The second ("bottom") number is called the diastolic pressure. It is a measure of the pressure in your arteries as the heart relaxes. What does my blood pressure reading mean? Blood pressure is classified into four stages. Based on your blood pressure reading, your health care provider may use the following stages to determine what type of treatment you need, if any. Systolic pressure and diastolic pressure are measured in a unit called mm Hg. Normal  Systolic pressure: below 120.  Diastolic pressure: below 80. Elevated  Systolic pressure: 120-129.  Diastolic pressure: below 80. Hypertension stage 1  Systolic pressure: 130-139.  Diastolic pressure: 80-89. Hypertension stage 2  Systolic pressure: 140 or above.  Diastolic pressure: 90 or above. What health risks are associated with hypertension? Managing your hypertension is an important responsibility. Uncontrolled hypertension can lead to:  A heart attack.  A stroke.  A weakened blood vessel (aneurysm).  Heart failure.  Kidney damage.  Eye damage.  Metabolic syndrome.  Memory and concentration problems.  What changes can I make to manage my  hypertension? Hypertension can be managed by making lifestyle changes and possibly by taking medicines. Your health care provider will help you make a plan to bring your blood pressure within a normal range. Eating and drinking  Eat a diet that is high in fiber and potassium, and low in salt (sodium), added sugar, and fat. An example eating plan is called the DASH (Dietary Approaches to Stop Hypertension) diet. To eat this way: ? Eat plenty of fresh fruits and vegetables. Try to fill half of your plate at each meal with fruits and vegetables. ? Eat whole grains, such as whole wheat pasta, brown rice, or whole grain bread. Fill about one quarter of your plate with whole grains. ? Eat low-fat diary products. ? Avoid fatty cuts of meat, processed or cured meats, and poultry with skin. Fill about one quarter of your plate with lean proteins such as fish, chicken without skin, beans, eggs, and tofu. ? Avoid premade and processed foods. These tend to be higher in sodium, added sugar, and fat.  Reduce your daily sodium intake. Most people with hypertension should eat less than 1,500 mg of sodium a day.  Limit alcohol intake to no more than 1 drink a day for nonpregnant women and 2 drinks a day for men. One drink equals 12 oz of beer, 5 oz of wine, or 1 oz of hard liquor. Lifestyle  Work with your health care provider to maintain a healthy body weight, or to lose weight. Ask what an ideal weight is for you.  Get at least 30 minutes of exercise that causes your heart to beat faster (aerobic exercise) most days of the week. Activities may include walking, swimming, or biking.  Include exercise   to strengthen your muscles (resistance exercise), such as weight lifting, as part of your weekly exercise routine. Try to do these types of exercises for 30 minutes at least 3 days a week.  Do not use any products that contain nicotine or tobacco, such as cigarettes and e-cigarettes. If you need help quitting, ask  your health care provider.  Control any long-term (chronic) conditions you have, such as high cholesterol or diabetes. Monitoring  Monitor your blood pressure at home as told by your health care provider. Your personal target blood pressure may vary depending on your medical conditions, your age, and other factors.  Have your blood pressure checked regularly, as often as told by your health care provider. Working with your health care provider  Review all the medicines you take with your health care provider because there may be side effects or interactions.  Talk with your health care provider about your diet, exercise habits, and other lifestyle factors that may be contributing to hypertension.  Visit your health care provider regularly. Your health care provider can help you create and adjust your plan for managing hypertension. Will I need medicine to control my blood pressure? Your health care provider may prescribe medicine if lifestyle changes are not enough to get your blood pressure under control, and if:  Your systolic blood pressure is 130 or higher.  Your diastolic blood pressure is 80 or higher.  Take medicines only as told by your health care provider. Follow the directions carefully. Blood pressure medicines must be taken as prescribed. The medicine does not work as well when you skip doses. Skipping doses also puts you at risk for problems. Contact a health care provider if:  You think you are having a reaction to medicines you have taken.  You have repeated (recurrent) headaches.  You feel dizzy.  You have swelling in your ankles.  You have trouble with your vision. Get help right away if:  You develop a severe headache or confusion.  You have unusual weakness or numbness, or you feel faint.  You have severe pain in your chest or abdomen.  You vomit repeatedly.  You have trouble breathing. Summary  Hypertension is when the force of blood pumping through  your arteries is too strong. If this condition is not controlled, it may put you at risk for serious complications.  Your personal target blood pressure may vary depending on your medical conditions, your age, and other factors. For most people, a normal blood pressure is less than 120/80.  Hypertension is managed by lifestyle changes, medicines, or both. Lifestyle changes include weight loss, eating a healthy, low-sodium diet, exercising more, and limiting alcohol. This information is not intended to replace advice given to you by your health care provider. Make sure you discuss any questions you have with your health care provider. Document Released: 01/31/2012 Document Revised: 04/05/2016 Document Reviewed: 04/05/2016 Elsevier Interactive Patient Education  2018 [employer]vier Inc[employer]  

## 2017-12-07 LAB — FOLATE: FOLATE: 16.2 ng/mL (ref >5.9)

## 2017-12-07 LAB — VITAMIN B12: Vitamin B-12: 353 pg/mL (ref 211–911)

## 2017-12-09 NOTE — Progress Notes (Signed)
Thank you for sending this case to me. I have reviewed the entire note, and am familiar with him.  He is doing poorly and clearly the [facility] Rodney is not working.  It is not that the Remicade did not work at all, it just was not controlling the Crohn's as well as wanted.  However, it worked better [facility] as evidenced by worsening anemia and persistently elevated ESR and symptoms in last several months.  Plan as follows:  Cancel colonoscopy Discontinue entyvio Start prednisone 10 mg tablets.  Sig:  3 tablets (30 mg) once daily for 2 weeks, then 20 mg once daily for 2 weeks, then 10 mg once daily for 1 week, 5 mg once daily for 1 week.  Disp #90 tablets See me in 4-5 weeks Work on approval process for Remicade (infliximab) because I believe we will need to resume it along with another medicine to attempt control of this condition.  For documentation purposes:  Julian Garner has severe ileal Crohn's disease that requires chronic medicines with regular office visits and has a significant impact on his life.  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  Wilfrid Lund, MD

## 2017-12-10 ENCOUNTER — Other Ambulatory Visit: Payer: Self-pay

## 2017-12-10 ENCOUNTER — Telehealth: Payer: Self-pay

## 2017-12-10 DIAGNOSIS — K50019 Crohn's disease of small intestine with unspecified complications: Secondary | ICD-10-CM

## 2017-12-10 MED ORDER — PREDNISONE 10 MG PO TABS: ORAL_TABLET | ORAL | 0 refills | Status: DC

## 2017-12-10 NOTE — Telephone Encounter (Signed)
When TB quant gold returns normal, please schedule his infliximab infusion ASAP.  His does is 10 mg/kg.  Last recorded weight 83 kg on 7/18.   So his dose is 800 miligrams.  Keep OV as scheduled.

## 2017-12-10 NOTE — Telephone Encounter (Signed)
Contacted patient, let him know of plan to cancel the colonoscopy, start the prednisone. Patient requested to have Rx sent into [facility] on [address]. Also let him know of plan to stop the Entyvio and restart Remicade.  Dr. Loletha Carrow, what dosage of Remicade do you want to start him at and when? Last Entyvio infusion was 09/25/17, past due for it. Orders were put in for 11/21/17 but it doesn't look like he received it and patient could not remember when last had it. Patient does not need a prior authorization as he is Medicare and receives it at the hospital.  Also, patient was supposed to have a quantiferon gold plus test at last office visit in April, wrong test was ordered therefore lab did not draw it. I have asked patient to come in this week for correct test. Scheduled for follow up office visit on 01/04/18.  Thanks, [name]

## 2017-12-11 ENCOUNTER — Other Ambulatory Visit: Payer: Self-pay

## 2017-12-11 ENCOUNTER — Telehealth: Payer: Self-pay | Admitting: Gastroenterology

## 2017-12-11 DIAGNOSIS — K50019 Crohn's disease of small intestine with unspecified complications: Secondary | ICD-10-CM

## 2017-12-11 NOTE — Telephone Encounter (Signed)
I have been considering his case some more.  BEFORE his remicade infusion, I need the result of a remicade (infliximab) antibody.  Please have him get it drawn at the lab.  If he has not gone for the TB quant gold, then it could be drawn at the same time.   No infliximab level needed, of course, since he is not currently on the medicine.  This is essential to assess the risk of an infusion reaction since he has been off the remicade for so long.

## 2017-12-11 NOTE — Telephone Encounter (Signed)
Ordered test, cannot order just the infliximab antibody, therefore had to order the infliximab concentration + AB. Patient has not had the quantiferon gold plus TB drawn yet.

## 2017-12-13 ENCOUNTER — Ambulatory Visit: Payer: Medicare Other | Admitting: Internal Medicine

## 2017-12-13 DIAGNOSIS — R0989 Other specified symptoms and signs involving the circulatory and respiratory systems: Secondary | ICD-10-CM

## 2017-12-14 ENCOUNTER — Encounter: Payer: Self-pay | Admitting: *Deleted

## 2017-12-14 ENCOUNTER — Other Ambulatory Visit (INDEPENDENT_AMBULATORY_CARE_PROVIDER_SITE_OTHER): Payer: Medicare Other

## 2017-12-14 DIAGNOSIS — D500 Iron deficiency anemia secondary to blood loss (chronic): Secondary | ICD-10-CM

## 2017-12-14 DIAGNOSIS — K50019 Crohn's disease of small intestine with unspecified complications: Secondary | ICD-10-CM | POA: Diagnosis not present

## 2017-12-14 DIAGNOSIS — K5000 Crohn's disease of small intestine without complications: Secondary | ICD-10-CM

## 2017-12-14 LAB — CBC WITH DIFFERENTIAL/PLATELET
BASOS PCT: 1 % (ref 0.0–3.0)
Basophils Absolute: 0.1 10*3/uL (ref 0.0–0.1)
EOS PCT: 2.7 % (ref 0.0–5.0)
Eosinophils Absolute: 0.2 10*3/uL (ref 0.0–0.7)
HCT: 35.7 % — ABNORMAL LOW (ref 39.0–52.0)
Hemoglobin: 11.9 g/dL — ABNORMAL LOW (ref 13.0–17.0)
Lymphocytes Relative: 16 % (ref 12.0–46.0)
Lymphs Abs: 1 10*3/uL (ref 0.7–4.0)
MCHC: 33.5 g/dL (ref 30.0–36.0)
MCV: 100.3 fl — ABNORMAL HIGH (ref 78.0–100.0)
Monocytes Absolute: 0.5 10*3/uL (ref 0.1–1.0)
Monocytes Relative: 8.2 % (ref 3.0–12.0)
Neutro Abs: 4.4 10*3/uL (ref 1.4–7.7)
Neutrophils Relative %: 72.1 % (ref 43.0–77.0)
Platelets: 310 10*3/uL (ref 150.0–400.0)
RBC: 3.56 Mil/uL — ABNORMAL LOW (ref 4.22–5.81)
RDW: 14.6 % (ref 11.5–15.5)
WBC: 6.1 10*3/uL (ref 4.0–10.5)

## 2017-12-14 NOTE — Addendum Note (Signed)
Addended by: Trenda Moots on: 6/85/9923 12:49 PM   Modules accepted: Orders

## 2017-12-15 LAB — QUANTIFERON-TB GOLD PLUS
NIL: 0.7 IU/mL
QuantiFERON-TB Gold Plus: NEGATIVE

## 2017-12-20 ENCOUNTER — Ambulatory Visit: Payer: Medicare Other

## 2017-12-20 ENCOUNTER — Other Ambulatory Visit: Payer: Self-pay

## 2017-12-20 DIAGNOSIS — K50019 Crohn's disease of small intestine with unspecified complications: Secondary | ICD-10-CM

## 2017-12-21 ENCOUNTER — Ambulatory Visit: Payer: Medicare Other

## 2017-12-21 LAB — INFLIXIMAB (IFX) CONC+ IFX AB
Anti-Infliximab Antibody: <22 ng/mL
Infliximab Drug Level: <0.4 ug/mL

## 2017-12-25 ENCOUNTER — Ambulatory Visit: Payer: Medicare Other

## 2017-12-27 ENCOUNTER — Ambulatory Visit (INDEPENDENT_AMBULATORY_CARE_PROVIDER_SITE_OTHER): Payer: Medicare Other | Admitting: Family Medicine

## 2017-12-27 DIAGNOSIS — E538 Deficiency of other specified B group vitamins: Secondary | ICD-10-CM | POA: Diagnosis not present

## 2017-12-27 MED ORDER — CYANOCOBALAMIN 1000 MCG/ML IJ SOLN
1000.0000 ug | Freq: Once | INTRAMUSCULAR | Status: AC
  Administered 2017-12-27: 1000 ug via INTRAMUSCULAR

## 2017-12-27 NOTE — Progress Notes (Signed)
Per orders of Dr. Banks, injection of Vitamin B 12 given by Alexsys Eskin ANN. Patient tolerated injection well. 

## 2018-01-02 ENCOUNTER — Ambulatory Visit (HOSPITAL_COMMUNITY)
Admission: RE | Admit: 2018-01-02 | Discharge: 2018-01-02 | Disposition: A | Discharge status: Home / Self Care | Payer: Medicare Other | Source: Ambulatory Visit | Attending: Gastroenterology | Admitting: Gastroenterology

## 2018-01-02 ENCOUNTER — Ambulatory Visit: Payer: Medicare Other | Admitting: Family Medicine

## 2018-01-02 DIAGNOSIS — K50019 Crohn's disease of small intestine with unspecified complications: Secondary | ICD-10-CM | POA: Diagnosis not present

## 2018-01-02 MED ORDER — ACETAMINOPHEN 325 MG PO TABS
650.0000 mg | ORAL_TABLET | Freq: Once | ORAL | Status: AC
  Administered 2018-01-02: 650 mg via ORAL

## 2018-01-02 MED ORDER — DIPHENHYDRAMINE HCL 25 MG PO CAPS
ORAL_CAPSULE | ORAL | Status: AC
  Filled 2018-01-02: qty 1

## 2018-01-02 MED ORDER — SODIUM CHLORIDE 0.9 % IV SOLN
10.0000 mg/kg | INTRAVENOUS | Status: DC
  Administered 2018-01-02: 800 mg via INTRAVENOUS
  Filled 2018-01-02: qty 80

## 2018-01-02 MED ORDER — DIPHENHYDRAMINE HCL 25 MG PO CAPS
50.0000 mg | ORAL_CAPSULE | Freq: Once | ORAL | Status: AC
  Administered 2018-01-02: 50 mg via ORAL

## 2018-01-02 MED ORDER — ACETAMINOPHEN 325 MG PO TABS
ORAL_TABLET | ORAL | Status: AC
  Filled 2018-01-02: qty 2

## 2018-01-04 ENCOUNTER — Other Ambulatory Visit (INDEPENDENT_AMBULATORY_CARE_PROVIDER_SITE_OTHER): Payer: Medicare Other

## 2018-01-04 ENCOUNTER — Ambulatory Visit (INDEPENDENT_AMBULATORY_CARE_PROVIDER_SITE_OTHER): Payer: Medicare Other | Admitting: Gastroenterology

## 2018-01-04 ENCOUNTER — Encounter: Payer: Self-pay | Admitting: Gastroenterology

## 2018-01-04 VITALS — BP 140/80 | HR 70 | Ht 74.0 in | Wt 187.4 lb

## 2018-01-04 DIAGNOSIS — D500 Iron deficiency anemia secondary to blood loss (chronic): Secondary | ICD-10-CM

## 2018-01-04 DIAGNOSIS — R197 Diarrhea, unspecified: Secondary | ICD-10-CM | POA: Diagnosis not present

## 2018-01-04 DIAGNOSIS — K50019 Crohn's disease of small intestine with unspecified complications: Secondary | ICD-10-CM

## 2018-01-04 DIAGNOSIS — R1031 Right lower quadrant pain: Secondary | ICD-10-CM

## 2018-01-04 DIAGNOSIS — E538 Deficiency of other specified B group vitamins: Secondary | ICD-10-CM

## 2018-01-04 LAB — IBC PANEL
Iron: 72 ug/dL (ref 42–165)
SATURATION RATIOS: 17.1 % — AB (ref 20.0–50.0)
Transferrin: 301 mg/dL (ref 212.0–360.0)

## 2018-01-04 LAB — CBC WITH DIFFERENTIAL/PLATELET
BASOS ABS: 0 10*3/uL (ref 0.0–0.1)
Basophils Relative: 0.8 % (ref 0.0–3.0)
EOS PCT: 2.2 % (ref 0.0–5.0)
Eosinophils Absolute: 0.1 10*3/uL (ref 0.0–0.7)
HCT: 37.7 % — ABNORMAL LOW (ref 39.0–52.0)
HEMOGLOBIN: 12.2 g/dL — AB (ref 13.0–17.0)
LYMPHS ABS: 1.5 10*3/uL (ref 0.7–4.0)
Lymphocytes Relative: 25.2 % (ref 12.0–46.0)
MCHC: 32.4 g/dL (ref 30.0–36.0)
MCV: 102.8 fl — AB (ref 78.0–100.0)
MONO ABS: 0.6 10*3/uL (ref 0.1–1.0)
MONOS PCT: 10.7 % (ref 3.0–12.0)
NEUTROS PCT: 61.1 % (ref 43.0–77.0)
Neutro Abs: 3.6 10*3/uL (ref 1.4–7.7)
Platelets: 313 10*3/uL (ref 150.0–400.0)
RBC: 3.67 Mil/uL — AB (ref 4.22–5.81)
RDW: 15.6 % — ABNORMAL HIGH (ref 11.5–15.5)
WBC: 5.9 10*3/uL (ref 4.0–10.5)

## 2018-01-04 LAB — SEDIMENTATION RATE: SED RATE: 66 mm/h — AB (ref 0–20)

## 2018-01-04 LAB — FERRITIN: Ferritin: 19.3 ng/mL — ABNORMAL LOW (ref 22.0–322.0)

## 2018-01-04 MED ORDER — DIPHENOXYLATE-ATROPINE 2.5-0.025 MG PO TABS: 1.0000 | ORAL_TABLET | Freq: Four times a day (QID) | ORAL | 2 refills | Status: DC | PRN

## 2018-01-04 NOTE — Progress Notes (Signed)
GI Progress Note  Chief Complaint: Crohn's ileitis  Subjective  History:  Julian Garner sees me for the first time since April of this year.  He was seen by 1 of our PAs last month with ongoing flare of Crohn's symptoms with diarrhea and lower abdominal pain with dark stool and rare rectal bleeding.  He has had a long complicated history and I changed him from Remicade to [facility] last year because he still had significant endoscopic findings of Crohn's on 10 mg/kg Remicade dosing.  He came to [place] on that dose from his previous provider [place] of [place].  He has had at least one prior resection.  Also notable is a persistent decrease in hemoglobin that was iron deficient and presumed to be from chronic GI blood loss.  He was having intermittent chest pain that was not always exertional, and was seen by cardiology consultant.  They thought the anemia was at least part of the chest pain, and in fact he says it is improved since he was given an IV iron treatment with improvement in hemoglobin.  He also underwent an exercise tolerance test in June showing no ST changes. After the recent visit with our APP, and consideration of his lab results, I put him on a prednisone taper and decided to resume Remicade.  Up-to-date TB QuantiFERON gold testing was negative, and infliximab antibodies were negative.  He is now down to 20 mg a day of prednisone, and will drop down to 10 mg tomorrow with a plan to taper to 5 mg a day week later and then often week.  The day before yesterday he had Remicade 10 mg/kg.  He previously got this dosing every 8 weeks, but now I plan to go back to every 4 weeks at least for the next few infusions. Loye has 5-6 semi-formed to loose BMs per day with some urgency.  He has not had any travel or antibiotic use.  He has right lower quadrant abdominal pain.  His appetite is variable and his weight stable overall for the last year.  ROS: Cardiovascular:  no chest  pain Respiratory: no dyspnea No painful eye redness, no rash no joint pain  Remainder of systems negative except as above The patient's Past Medical, Family and Social History were reviewed and are on file in the EMR.  Objective:  Med list reviewed  Current Outpatient Medications:  .  amLODipine (NORVASC) 2.5 MG tablet, TAKE 1 TABLET BY MOUTH EVERY DAY, Disp: 90 tablet, Rfl: 0 .  cyanocobalamin (,VITAMIN B-12,) 1000 MCG/ML injection, Inject 1 mL (1,000 mcg total) into the muscle every 30 (thirty) days., Disp: 10 mL, Rfl: 0 .  ferrous sulfate (FERROUSUL) 325 (65 FE) MG tablet, Take 1 tablet (325 mg total) by mouth 2 (two) times daily with a meal., Disp: 90 tablet, Rfl: 0 .  predniSONE (DELTASONE) 10 MG tablet, Take by mouth daily 3 tabs (30 mg) for 2 weeks, then 2 tabs (20 mg) daily for 2 weeks, then 10 mg daily for 1 week, then 5 mg a day for 1 wk, Disp: 90 tablet, Rfl: 0 .  diphenoxylate-atropine (LOMOTIL) 2.5-0.025 MG tablet, Take 1 tablet by mouth 4 (four) times daily as needed for diarrhea or loose stools., Disp: 45 tablet, Rfl: 2   Vital signs in last 24 hrs: Vitals:   01/04/18 1414  BP: 140/80  Pulse: 70    Physical Exam  Not acutely ill-appearing  HEENT: sclera anicteric, oral mucosa moist without lesions  Neck: supple, no thyromegaly, JVD or lymphadenopathy  Cardiac: RRR without murmurs, S1S2 heard, no peripheral edema  Pulm: clear to auscultation bilaterally, normal RR and effort noted  Abdomen: soft, RLQ tenderness, with active bowel sounds. No guarding or palpable hepatosplenomegaly.  Multiple scars   skin; warm and dry, no jaundice or rash  Recent Labs:  CBC Latest Ref Rng & Units 01/04/2018 12/14/2017 12/06/2017  WBC 4.0 - 10.5 K/uL 5.9 6.1 5.1  Hemoglobin 13.0 - 17.0 g/dL 12.2(L) 11.9(L) 12.3(L)  Hematocrit 39.0 - 52.0 % 37.7(L) 35.7(L) 36.7(L)  Platelets 150.0 - 400.0 K/uL 313.0 310.0 284.0   CMP Latest Ref Rng & Units 08/20/2017 07/05/2016 04/19/2016   Glucose 70 - 99 mg/dL 102(H) 86 82  BUN 6 - 23 mg/dL _0 Creatinine 0.40 - 1.50 mg/dL 1.42 1.61(H) 1.78(H)  Sodium 135 - 145 mEq/L 136 139 138  Potassium 3.5 - 5.1 mEq/L 3.8 4.3 4.1  Chloride 96 - 112 mEq/L 106 114(H) 108  CO2 19 - 32 mEq/L _1 Calcium 8.4 - 10.5 mg/dL 9.0 9.2 9.5  Total Protein 6.0 - 8.3 g/dL 7.9 7.7 8.2  Total Bilirubin 0.2 - 1.2 mg/dL 0.4 0.4 0.4  Alkaline Phos 39 - 117 U/L 75 70 69  AST 0 - 37 U/L _2 ALT 0 - 53 U/L _3 B12 353 Ferritin 23 ESR 130 in April, down to 118 on 7/18  Infliximab antibody negative  Radiologic studies:  ETT - no ST changes 10/31/17  _4 @ Assessment: Encounter Diagnoses  Name Primary?  . Crohn's disease of ileum with complication ([place]) Yes  . Iron deficiency anemia due to chronic blood loss   . RLQ abdominal pain   . Diarrhea, unspecified type   . Vitamin B12 deficiency    Poorly controlled ileal Crohn's disease after 2 prior surgeries.  Entyvio just did not work for him.  Although he was under suboptimal control on Remicade before, it was certainly superior control to Entyvio. He had ongoing blood loss causing severe anemia which is now improved.   Plan: Taper steroids as previously instructed and noted above Remicade 10 mg/kg every 4 weeks Labs today: CBC, ESR, iron studies Continue regular B12 dosing Lomotil was prescribed as needed Follow-up in 8 weeks, call sooner as needed   Total time 45 minutes, over half spent face-to-face with patient in counseling and coordination of care.   Nelida Meuse III

## 2018-01-04 NOTE — Patient Instructions (Signed)
If you are age 50 or older, your body mass index should be between 23-30. Your Body mass index is 24.06 kg/m. If this is out of the aforementioned range listed, please consider follow up with your Primary Care Provider.  If you are age 36 or younger, your body mass index should be between 19-25. Your Body mass index is 24.06 kg/m. If this is out of the aformentioned range listed, please consider follow up with your Primary Care Provider.   Your provider has requested that you go to the basement level for lab work before leaving today. Press "B" on the elevator. The lab is located at the first door on the left as you exit the elevator.  It was a pleasure to see you today!  Dr. Loletha Carrow

## 2018-01-07 ENCOUNTER — Other Ambulatory Visit: Payer: Self-pay

## 2018-01-07 ENCOUNTER — Telehealth: Payer: Self-pay

## 2018-01-07 DIAGNOSIS — K50019 Crohn's disease of small intestine with unspecified complications: Secondary | ICD-10-CM

## 2018-01-07 MED ORDER — SODIUM CHLORIDE 0.9 % IV SOLN: 10.0000 mg/kg | INTRAVENOUS | Status: DC

## 2018-01-07 NOTE — Telephone Encounter (Signed)
Scheduled patient's next Remicade infusion for 9/11 at 12:00 [facility] medical day. I have also kept his other infusion for 10/9. Patient informed.

## 2018-01-07 NOTE — Telephone Encounter (Signed)
-----   Message from [place], MD sent at 01/04/2018  5:14 PM EDT ----- Please reschedule his next infliximab treatment for 4 weeks from now rather than 8 weeks.  My note from today is in the chart.  - HD

## 2018-01-10 ENCOUNTER — Encounter: Payer: Medicare Other | Admitting: Gastroenterology

## 2018-01-15 ENCOUNTER — Encounter

## 2018-01-15 ENCOUNTER — Ambulatory Visit: Payer: Medicare Other | Admitting: Internal Medicine

## 2018-01-15 DIAGNOSIS — R0989 Other specified symptoms and signs involving the circulatory and respiratory systems: Secondary | ICD-10-CM

## 2018-01-16 ENCOUNTER — Encounter: Payer: Self-pay | Admitting: *Deleted

## 2018-01-22 ENCOUNTER — Other Ambulatory Visit: Payer: Self-pay | Admitting: Gastroenterology

## 2018-01-30 ENCOUNTER — Ambulatory Visit (HOSPITAL_COMMUNITY)
Admission: RE | Admit: 2018-01-30 | Discharge: 2018-01-30 | Disposition: A | Discharge status: Home / Self Care | Payer: Medicare Other | Source: Ambulatory Visit | Attending: Gastroenterology | Admitting: Gastroenterology

## 2018-01-30 DIAGNOSIS — K50019 Crohn's disease of small intestine with unspecified complications: Secondary | ICD-10-CM | POA: Insufficient documentation

## 2018-01-30 MED ORDER — ACETAMINOPHEN 325 MG PO TABS
ORAL_TABLET | ORAL | Status: AC
  Administered 2018-01-30: 650 mg via ORAL
  Filled 2018-01-30: qty 2

## 2018-01-30 MED ORDER — ACETAMINOPHEN 325 MG PO TABS
650.0000 mg | ORAL_TABLET | Freq: Once | ORAL | Status: AC
  Administered 2018-01-30: 650 mg via ORAL

## 2018-01-30 MED ORDER — DIPHENHYDRAMINE HCL 25 MG PO CAPS
50.0000 mg | ORAL_CAPSULE | Freq: Once | ORAL | Status: AC
  Administered 2018-01-30: 50 mg via ORAL

## 2018-01-30 MED ORDER — SODIUM CHLORIDE 0.9 % IV SOLN
10.0000 mg/kg | INTRAVENOUS | Status: DC
  Administered 2018-01-30: 900 mg via INTRAVENOUS
  Filled 2018-01-30: qty 90

## 2018-01-30 MED ORDER — DIPHENHYDRAMINE HCL 25 MG PO CAPS
ORAL_CAPSULE | ORAL | Status: AC
  Administered 2018-01-30: 50 mg via ORAL
  Filled 2018-01-30: qty 2

## 2018-02-05 ENCOUNTER — Other Ambulatory Visit: Payer: Self-pay | Admitting: Gastroenterology

## 2018-02-08 ENCOUNTER — Ambulatory Visit (INDEPENDENT_AMBULATORY_CARE_PROVIDER_SITE_OTHER): Payer: Medicare Other | Admitting: *Deleted

## 2018-02-08 ENCOUNTER — Ambulatory Visit: Payer: Medicare Other

## 2018-02-08 DIAGNOSIS — E538 Deficiency of other specified B group vitamins: Secondary | ICD-10-CM

## 2018-02-08 MED ORDER — CYANOCOBALAMIN 1000 MCG/ML IJ SOLN
1000.0000 ug | Freq: Once | INTRAMUSCULAR | Status: AC
  Administered 2018-02-08: 1000 ug via INTRAMUSCULAR

## 2018-02-08 NOTE — Progress Notes (Signed)
Per orders of Dr. Volanda Napoleon, injection of B12 given by Dorrene German. Patient tolerated injection well.   Dr. Mallory Shirk you review in PCP absence?

## 2018-02-25 ENCOUNTER — Ambulatory Visit: Payer: Medicare Other | Admitting: Internal Medicine

## 2018-02-27 ENCOUNTER — Encounter

## 2018-02-27 ENCOUNTER — Ambulatory Visit (HOSPITAL_COMMUNITY): Admission: RE | Admit: 2018-02-27 | Payer: Medicare Other | Source: Ambulatory Visit

## 2018-02-27 ENCOUNTER — Ambulatory Visit: Payer: Medicare Other | Admitting: Internal Medicine

## 2018-03-08 ENCOUNTER — Ambulatory Visit: Payer: Medicare Other

## 2018-03-12 ENCOUNTER — Ambulatory Visit: Payer: Medicare Other | Admitting: Gastroenterology

## 2018-03-14 ENCOUNTER — Ambulatory Visit: Payer: Medicare Other | Admitting: Gastroenterology

## 2018-03-20 ENCOUNTER — Other Ambulatory Visit (INDEPENDENT_AMBULATORY_CARE_PROVIDER_SITE_OTHER): Payer: Medicare Other

## 2018-03-20 ENCOUNTER — Ambulatory Visit (INDEPENDENT_AMBULATORY_CARE_PROVIDER_SITE_OTHER): Payer: Medicare Other | Admitting: Gastroenterology

## 2018-03-20 ENCOUNTER — Encounter: Payer: Self-pay | Admitting: Gastroenterology

## 2018-03-20 VITALS — BP 116/80 | HR 76 | Ht 74.0 in | Wt 187.0 lb

## 2018-03-20 DIAGNOSIS — K50019 Crohn's disease of small intestine with unspecified complications: Secondary | ICD-10-CM

## 2018-03-20 DIAGNOSIS — D500 Iron deficiency anemia secondary to blood loss (chronic): Secondary | ICD-10-CM

## 2018-03-20 DIAGNOSIS — K529 Noninfective gastroenteritis and colitis, unspecified: Secondary | ICD-10-CM | POA: Diagnosis not present

## 2018-03-20 LAB — FERRITIN: Ferritin: 40.4 ng/mL (ref 22.0–322.0)

## 2018-03-20 LAB — CBC WITH DIFFERENTIAL/PLATELET
BASOS ABS: 0 10*3/uL (ref 0.0–0.1)
Basophils Relative: 0.6 % (ref 0.0–3.0)
EOS ABS: 0 10*3/uL (ref 0.0–0.7)
Eosinophils Relative: 0.8 % (ref 0.0–5.0)
HEMATOCRIT: 38.2 % — AB (ref 39.0–52.0)
HEMOGLOBIN: 12.8 g/dL — AB (ref 13.0–17.0)
Lymphocytes Relative: 17.7 % (ref 12.0–46.0)
Lymphs Abs: 0.8 10*3/uL (ref 0.7–4.0)
MCHC: 33.6 g/dL (ref 30.0–36.0)
MCV: 102.3 fl — ABNORMAL HIGH (ref 78.0–100.0)
MONO ABS: 0.8 10*3/uL (ref 0.1–1.0)
Monocytes Relative: 18.9 % — ABNORMAL HIGH (ref 3.0–12.0)
Neutro Abs: 2.7 10*3/uL (ref 1.4–7.7)
Neutrophils Relative %: 62 % (ref 43.0–77.0)
Platelets: 233 10*3/uL (ref 150.0–400.0)
RBC: 3.74 Mil/uL — AB (ref 4.22–5.81)
RDW: 14.3 % (ref 11.5–15.5)
WBC: 4.3 10*3/uL (ref 4.0–10.5)

## 2018-03-20 LAB — SEDIMENTATION RATE: SED RATE: 28 mm/h — AB (ref 0–20)

## 2018-03-20 LAB — IBC PANEL
IRON: 48 ug/dL (ref 42–165)
SATURATION RATIOS: 10.9 % — AB (ref 20.0–50.0)
TRANSFERRIN: 315 mg/dL (ref 212.0–360.0)

## 2018-03-20 NOTE — Patient Instructions (Signed)
If you are age 50 or older, your body mass index should be between 23-30. Your Body mass index is 24.01 kg/m. If this is out of the aforementioned range listed, please consider follow up with your Primary Care Provider.  If you are age 42 or younger, your body mass index should be between 19-25. Your Body mass index is 24.01 kg/m. If this is out of the aformentioned range listed, please consider follow up with your Primary Care Provider.   Your provider has requested that you go to the basement level for lab work before leaving today. Press "B" on the elevator. The lab is located at the first door on the left as you exit the elevator.   It was a pleasure to see you today!  Dr. Loletha Carrow

## 2018-03-20 NOTE — Progress Notes (Signed)
[facility] Progress Note  Chief Complaint: Ileal Crohn's disease  Subjective  History:  Julian Garner follows up for his ileal Crohn's disease and anemia of chronic GI blood loss.  At last visit, the plan was to increase Remicade dosing to every 4 weeks.  He had gone on August 14, and he went again on September 11, either canceled or did not show for his October 9 visit.  He tells me that he must have just forgotten about it, and he was uncertain if a date had been rescheduled or when that might be. Looked up his appointments, and he is scheduled for Remicade on November 6, and I gave him that date and arrival time.  I also stressed the importance of making all of his appointments.  When I entered the room, he was on the phone with primary care arranging his B12 shot next week.  I also recommend he get a flu shot at that time.  He has chronic diarrhea as before, with 4-5 BMs per day, and takes Lomotil about twice a day. He has not seen any bleeding, he says his energy level is reasonably good and his appetite is good, and review of records shows that his weight is stable.  ROS: Cardiovascular:  no chest pain since his hemoglobin improved with change of therapy and IV iron.  Respiratory: no dyspnea No rash, painful eye redness or joint swelling.  Remainder of systems negative except as above The patient's Past Medical, Family and Social History were reviewed and are on file in the EMR.  Objective:  Med list reviewed  Current Outpatient Medications:  .  amLODipine (NORVASC) 2.5 MG tablet, TAKE 1 TABLET BY MOUTH EVERY DAY, Disp: 90 tablet, Rfl: 0 .  cyanocobalamin (,VITAMIN B-12,) 1000 MCG/ML injection, Inject 1 mL (1,000 mcg total) into the muscle every 30 (thirty) days., Disp: 10 mL, Rfl: 0 .  diphenoxylate-atropine (LOMOTIL) 2.5-0.025 MG tablet, Take 1 tablet by mouth 4 (four) times daily as needed for diarrhea or loose stools., Disp: 45 tablet, Rfl: 2 .  ferrous sulfate  (FERROUSUL) 325 (65 FE) MG tablet, Take 1 tablet (325 mg total) by mouth 2 (two) times daily with a meal., Disp: 90 tablet, Rfl: 0 .  inFLIXimab 900 mg in sodium chloride 0.9 % 160 mL, Inject 900 mg into the vein every 30 (thirty) days., Disp: , Rfl:  .  predniSONE (DELTASONE) 10 MG tablet, Take by mouth daily 3 tabs (30 mg) for 2 weeks, then 2 tabs (20 mg) daily for 2 weeks, then 10 mg daily for 1 week, then 5 mg a day for 1 wk, Disp: 90 tablet, Rfl: 0  He has been off the prednisone since late August.  Vital signs in last 24 hrs: Vitals:   03/20/18 1542  BP: 116/80  Pulse: 76    Physical Exam  He is well-appearing  HEENT: sclera anicteric, oral mucosa moist without lesions  Neck: supple, no thyromegaly, JVD or lymphadenopathy  Cardiac: RRR without murmurs, S1S2 heard, no peripheral edema  Pulm: clear to auscultation bilaterally, normal RR and effort noted  Abdomen: soft, no tenderness, with active bowel sounds. No guarding or palpable hepatosplenomegaly.  Multiple scars as before  Skin; warm and dry, no jaundice or rash  Recent Labs:  CBC Latest Ref Rng & Units 01/04/2018 12/14/2017 12/06/2017  WBC 4.0 - 10.5 K/uL 5.9 6.1 5.1  Hemoglobin 13.0 - 17.0 g/dL 12.2(L) 11.9(L) 12.3(L)  Hematocrit 39.0 - 52.0 % 37.7(L) 35.7(L)  36.7(L)  Platelets 150.0 - 400.0 K/uL 313.0 310.0 284.0   Ferritin 19 on August 16  ESR 66 on August 16  Radiologic studies: None recent   @ASSESSMENTPLANBEGIN @ Assessment: Encounter Diagnoses  Name Primary?  . Crohn's disease of ileum with complication ([place]) Yes  . Iron deficiency anemia due to chronic blood loss   . Chronic diarrhea    Severe ileal Crohn's disease with prior surgical treatment, suboptimal response to 10 mg/kg dosing every 8 weeks.  He had no response at all to [facility], which caused a major clinical decompensation.  He is definitely much improved back on Remicade 10 mg/kg, and I would like him to do it every 4 weeks.  His history of  noncompliance remains bothersome.  He is taking his iron tablets "sometimes".  I stressed the importance of making it to his Remicade infusion next week.  I would like him to have another 4 weeks after that, and I will see him sometime in December.  Today he went to the lab for ESR, CBC and iron studies.   Total time 30 minutes, over half spent face-to-face with patient in counseling and coordination of care.   Nelida Meuse III

## 2018-03-25 ENCOUNTER — Ambulatory Visit (INDEPENDENT_AMBULATORY_CARE_PROVIDER_SITE_OTHER): Payer: Medicare Other | Admitting: Family Medicine

## 2018-03-25 ENCOUNTER — Encounter: Payer: Self-pay | Admitting: Family Medicine

## 2018-03-25 VITALS — BP 158/80 | HR 84 | Temp 98.5°F | Wt 191.0 lb

## 2018-03-25 DIAGNOSIS — Z23 Encounter for immunization: Secondary | ICD-10-CM | POA: Diagnosis not present

## 2018-03-25 DIAGNOSIS — I10 Essential (primary) hypertension: Secondary | ICD-10-CM

## 2018-03-25 DIAGNOSIS — E538 Deficiency of other specified B group vitamins: Secondary | ICD-10-CM | POA: Diagnosis not present

## 2018-03-25 MED ORDER — AMLODIPINE BESYLATE 2.5 MG PO TABS: 2.5000 mg | ORAL_TABLET | Freq: Every day | ORAL | 3 refills | Status: AC

## 2018-03-25 MED ORDER — CYANOCOBALAMIN 1000 MCG/ML IJ SOLN
1000.0000 ug | Freq: Once | INTRAMUSCULAR | Status: AC
  Administered 2018-03-25: 1000 ug via INTRAMUSCULAR

## 2018-03-25 NOTE — Progress Notes (Signed)
Subjective:    Patient ID: Julian Garner, male    DOB: [birth year], 52 y.o.   MRN: [id number]  No chief complaint on file.   HPI Patient was seen today for follow-up on chronic conditions.  HTN: -Pt out of Norvasc x1 month. -States called clinic for refills, per chart review no med refill request seen. -states checks bp at home, typically 130s/80s -eating chicken and [employer] salads, though not eating other fast food.  Crohn's disease/h/o B12 def: -followed by GI -had labs on 03/20/18 -need B12 injection.  Last injection 02/08/18.  Past Medical History:  Diagnosis Date  . Anemia   . Crohn's colitis ([place])   . Hyperlipidemia     No Known Allergies  ROS General: Denies fever, chills, night sweats, changes in weight, changes in appetite HEENT: Denies headaches, ear pain, changes in vision, rhinorrhea, sore throat CV: Denies CP, palpitations, SOB, orthopnea Pulm: Denies SOB, cough, wheezing GI: Denies abdominal pain, nausea, vomiting, diarrhea, constipation GU: Denies dysuria, hematuria, frequency, vaginal discharge Msk: Denies muscle cramps, joint pains Neuro: Denies weakness, numbness, tingling Skin: Denies rashes, bruising Psych: Denies depression, anxiety, hallucinations    Objective:    Blood pressure (!) 158/80, pulse 84, temperature 98.5 F (36.9 C), temperature source Oral, weight 191 lb (86.6 kg), SpO2 97 %.  Repeat bp 163/82  Gen. Pleasant, well-nourished, in no distress, normal affect   Lungs: no accessory muscle use, CTAB, no wheezes or rales Cardiovascular: RRR, no m/r/g, no peripheral edema Neuro:  A&Ox3, CN II-XII intact, normal gait  Wt Readings from Last 3 Encounters:  03/20/18 187 lb (84.8 kg)  01/30/18 192 lb (87.1 kg)  01/04/18 187 lb 6 oz (85 kg)    Lab Results  Component Value Date   WBC 4.3 03/20/2018   HGB 12.8 (L) 03/20/2018   HCT 38.2 (L) 03/20/2018   PLT 233.0 03/20/2018   GLUCOSE 102 (H) 08/20/2017   CHOL 161 10/11/2017   TRIG  81 10/11/2017   HDL 96 10/11/2017   LDLCALC 49 10/11/2017   ALT 12 08/20/2017   AST 20 08/20/2017   NA 136 08/20/2017   K 3.8 08/20/2017   CL 106 08/20/2017   CREATININE 1.42 08/20/2017   BUN 15 08/20/2017   CO2 22 08/20/2017    Assessment/Plan:  Essential hypertension  -elevated -lifestyle modifications encouraged - Plan: amLODipine (NORVASC) 2.5 MG tablet  Vitamin B12 deficiency -B12 given this visit  Need for immunization against influenza -Influenza vaccine given  Follow-up in 1 month for BP and B12 injection  Grier Mitts, MD

## 2018-03-25 NOTE — Addendum Note (Signed)
Addended by: Wyvonne Lenz on: 03/25/2018 02:33 PM   Modules accepted: Orders

## 2018-03-25 NOTE — Addendum Note (Signed)
Addended by: Wyvonne Lenz on: 03/25/2018 02:37 PM   Modules accepted: Orders

## 2018-03-25 NOTE — Patient Instructions (Signed)
Managing Your Hypertension Hypertension is commonly called high blood pressure. This is when the force of your blood pressing against the walls of your arteries is too strong. Arteries are blood vessels that carry blood from your heart throughout your body. Hypertension forces the heart to work harder to pump blood, and may cause the arteries to become narrow or stiff. Having untreated or uncontrolled hypertension can cause heart attack, stroke, kidney disease, and other problems. What are blood pressure readings? A blood pressure reading consists of a higher number over a lower number. Ideally, your blood pressure should be below 120/80. The first ("top") number is called the systolic pressure. It is a measure of the pressure in your arteries as your heart beats. The second ("bottom") number is called the diastolic pressure. It is a measure of the pressure in your arteries as the heart relaxes. What does my blood pressure reading mean? Blood pressure is classified into four stages. Based on your blood pressure reading, your health care provider may use the following stages to determine what type of treatment you need, if any. Systolic pressure and diastolic pressure are measured in a unit called mm Hg. Normal  Systolic pressure: below 120.  Diastolic pressure: below 80. Elevated  Systolic pressure: 120-129.  Diastolic pressure: below 80. Hypertension stage 1  Systolic pressure: 130-139.  Diastolic pressure: 80-89. Hypertension stage 2  Systolic pressure: 140 or above.  Diastolic pressure: 90 or above. What health risks are associated with hypertension? Managing your hypertension is an important responsibility. Uncontrolled hypertension can lead to:  A heart attack.  A stroke.  A weakened blood vessel (aneurysm).  Heart failure.  Kidney damage.  Eye damage.  Metabolic syndrome.  Memory and concentration problems.  What changes can I make to manage my  hypertension? Hypertension can be managed by making lifestyle changes and possibly by taking medicines. Your health care provider will help you make a plan to bring your blood pressure within a normal range. Eating and drinking  Eat a diet that is high in fiber and potassium, and low in salt (sodium), added sugar, and fat. An example eating plan is called the DASH (Dietary Approaches to Stop Hypertension) diet. To eat this way: ? Eat plenty of fresh fruits and vegetables. Try to fill half of your plate at each meal with fruits and vegetables. ? Eat whole grains, such as whole wheat pasta, brown rice, or whole grain bread. Fill about one quarter of your plate with whole grains. ? Eat low-fat diary products. ? Avoid fatty cuts of meat, processed or cured meats, and poultry with skin. Fill about one quarter of your plate with lean proteins such as fish, chicken without skin, beans, eggs, and tofu. ? Avoid premade and processed foods. These tend to be higher in sodium, added sugar, and fat.  Reduce your daily sodium intake. Most people with hypertension should eat less than 1,500 mg of sodium a day.  Limit alcohol intake to no more than 1 drink a day for nonpregnant women and 2 drinks a day for men. One drink equals 12 oz of beer, 5 oz of wine, or 1 oz of hard liquor. Lifestyle  Work with your health care provider to maintain a healthy body weight, or to lose weight. Ask what an ideal weight is for you.  Get at least 30 minutes of exercise that causes your heart to beat faster (aerobic exercise) most days of the week. Activities may include walking, swimming, or biking.  Include exercise   to strengthen your muscles (resistance exercise), such as weight lifting, as part of your weekly exercise routine. Try to do these types of exercises for 30 minutes at least 3 days a week.  Do not use any products that contain nicotine or tobacco, such as cigarettes and e-cigarettes. If you need help quitting, ask  your health care provider.  Control any long-term (chronic) conditions you have, such as high cholesterol or diabetes. Monitoring  Monitor your blood pressure at home as told by your health care provider. Your personal target blood pressure may vary depending on your medical conditions, your age, and other factors.  Have your blood pressure checked regularly, as often as told by your health care provider. Working with your health care provider  Review all the medicines you take with your health care provider because there may be side effects or interactions.  Talk with your health care provider about your diet, exercise habits, and other lifestyle factors that may be contributing to hypertension.  Visit your health care provider regularly. Your health care provider can help you create and adjust your plan for managing hypertension. Will I need medicine to control my blood pressure? Your health care provider may prescribe medicine if lifestyle changes are not enough to get your blood pressure under control, and if:  Your systolic blood pressure is 130 or higher.  Your diastolic blood pressure is 80 or higher.  Take medicines only as told by your health care provider. Follow the directions carefully. Blood pressure medicines must be taken as prescribed. The medicine does not work as well when you skip doses. Skipping doses also puts you at risk for problems. Contact a health care provider if:  You think you are having a reaction to medicines you have taken.  You have repeated (recurrent) headaches.  You feel dizzy.  You have swelling in your ankles.  You have trouble with your vision. Get help right away if:  You develop a severe headache or confusion.  You have unusual weakness or numbness, or you feel faint.  You have severe pain in your chest or abdomen.  You vomit repeatedly.  You have trouble breathing. Summary  Hypertension is when the force of blood pumping through  your arteries is too strong. If this condition is not controlled, it may put you at risk for serious complications.  Your personal target blood pressure may vary depending on your medical conditions, your age, and other factors. For most people, a normal blood pressure is less than 120/80.  Hypertension is managed by lifestyle changes, medicines, or both. Lifestyle changes include weight loss, eating a healthy, low-sodium diet, exercising more, and limiting alcohol. This information is not intended to replace advice given to you by your health care provider. Make sure you discuss any questions you have with your health care provider. Document Released: 01/31/2012 Document Revised: 04/05/2016 Document Reviewed: 04/05/2016 Elsevier Interactive Patient Education  2018 [employer]vier Inc[employer]  

## 2018-03-27 ENCOUNTER — Ambulatory Visit (HOSPITAL_COMMUNITY)
Admission: RE | Admit: 2018-03-27 | Discharge: 2018-03-27 | Disposition: A | Discharge status: Home / Self Care | Payer: Medicare Other | Source: Ambulatory Visit | Attending: Gastroenterology | Admitting: Gastroenterology

## 2018-03-27 DIAGNOSIS — K5090 Crohn's disease, unspecified, without complications: Secondary | ICD-10-CM | POA: Diagnosis not present

## 2018-03-27 DIAGNOSIS — E538 Deficiency of other specified B group vitamins: Secondary | ICD-10-CM | POA: Diagnosis not present

## 2018-03-27 DIAGNOSIS — K50019 Crohn's disease of small intestine with unspecified complications: Secondary | ICD-10-CM

## 2018-03-27 MED ORDER — ACETAMINOPHEN 325 MG PO TABS
650.0000 mg | ORAL_TABLET | Freq: Once | ORAL | Status: AC
  Administered 2018-03-27: 650 mg via ORAL

## 2018-03-27 MED ORDER — ACETAMINOPHEN 325 MG PO TABS
ORAL_TABLET | ORAL | Status: AC
  Administered 2018-03-27: 650 mg via ORAL
  Filled 2018-03-27: qty 2

## 2018-03-27 MED ORDER — DIPHENHYDRAMINE HCL 25 MG PO CAPS
ORAL_CAPSULE | ORAL | Status: AC
  Administered 2018-03-27: 50 mg via ORAL
  Filled 2018-03-27: qty 2

## 2018-03-27 MED ORDER — SODIUM CHLORIDE 0.9 % IV SOLN
10.0000 mg/kg | INTRAVENOUS | Status: DC
  Administered 2018-03-27: 900 mg via INTRAVENOUS
  Filled 2018-03-27: qty 90

## 2018-03-27 MED ORDER — DIPHENHYDRAMINE HCL 25 MG PO CAPS
50.0000 mg | ORAL_CAPSULE | Freq: Once | ORAL | Status: AC
  Administered 2018-03-27: 50 mg via ORAL

## 2018-04-24 ENCOUNTER — Ambulatory Visit: Payer: Medicare Other

## 2018-04-25 ENCOUNTER — Encounter (HOSPITAL_COMMUNITY)
Admission: RE | Admit: 2018-04-25 | Discharge: 2018-04-25 | Disposition: A | Discharge status: Home / Self Care | Payer: Medicare Other | Source: Ambulatory Visit | Attending: Gastroenterology | Admitting: Gastroenterology

## 2018-04-25 ENCOUNTER — Ambulatory Visit (INDEPENDENT_AMBULATORY_CARE_PROVIDER_SITE_OTHER): Payer: Medicare Other

## 2018-04-25 DIAGNOSIS — K5000 Crohn's disease of small intestine without complications: Secondary | ICD-10-CM | POA: Diagnosis not present

## 2018-04-25 DIAGNOSIS — E538 Deficiency of other specified B group vitamins: Secondary | ICD-10-CM

## 2018-04-25 DIAGNOSIS — K50019 Crohn's disease of small intestine with unspecified complications: Secondary | ICD-10-CM

## 2018-04-25 MED ORDER — SODIUM CHLORIDE 0.9 % IV SOLN
10.0000 mg/kg | INTRAVENOUS | Status: DC
  Administered 2018-04-25: 900 mg via INTRAVENOUS
  Filled 2018-04-25: qty 90

## 2018-04-25 MED ORDER — DIPHENHYDRAMINE HCL 25 MG PO CAPS
50.0000 mg | ORAL_CAPSULE | Freq: Once | ORAL | Status: AC
  Administered 2018-04-25: 50 mg via ORAL

## 2018-04-25 MED ORDER — ACETAMINOPHEN 325 MG PO TABS
ORAL_TABLET | ORAL | Status: AC
  Administered 2018-04-25: 650 mg via ORAL
  Filled 2018-04-25: qty 2

## 2018-04-25 MED ORDER — CYANOCOBALAMIN 1000 MCG/ML IJ SOLN
1000.0000 ug | Freq: Once | INTRAMUSCULAR | Status: AC
  Administered 2018-04-25: 1000 ug via INTRAMUSCULAR

## 2018-04-25 MED ORDER — ACETAMINOPHEN 325 MG PO TABS
650.0000 mg | ORAL_TABLET | Freq: Once | ORAL | Status: AC
  Administered 2018-04-25: 650 mg via ORAL

## 2018-04-25 MED ORDER — DIPHENHYDRAMINE HCL 25 MG PO CAPS
ORAL_CAPSULE | ORAL | Status: AC
  Administered 2018-04-25: 50 mg via ORAL
  Filled 2018-04-25: qty 2

## 2018-04-25 NOTE — Progress Notes (Signed)
Per orders of Dr. Volanda Napoleon, injection of B12 given by Franco Collet. Patient tolerated injection well.

## 2018-05-21 ENCOUNTER — Other Ambulatory Visit (HOSPITAL_COMMUNITY): Payer: Self-pay | Admitting: *Deleted

## 2018-05-21 ENCOUNTER — Other Ambulatory Visit: Payer: Self-pay

## 2018-05-21 ENCOUNTER — Telehealth: Payer: Self-pay | Admitting: Gastroenterology

## 2018-05-21 DIAGNOSIS — K5090 Crohn's disease, unspecified, without complications: Secondary | ICD-10-CM

## 2018-05-21 NOTE — Telephone Encounter (Signed)
Julian Garner from [facility] infusion ctr. Called to inform tha pt is scheduled for remicade tomorrow and they need orders.

## 2018-05-23 ENCOUNTER — Ambulatory Visit: Payer: Medicare Other | Admitting: Gastroenterology

## 2018-05-23 ENCOUNTER — Telehealth: Payer: Self-pay | Admitting: Gastroenterology

## 2018-05-23 ENCOUNTER — Ambulatory Visit (HOSPITAL_COMMUNITY)
Admission: RE | Admit: 2018-05-23 | Discharge: 2018-05-23 | Disposition: A | Discharge status: Home / Self Care | Payer: Medicare Other | Source: Ambulatory Visit | Attending: Gastroenterology | Admitting: Gastroenterology

## 2018-05-23 DIAGNOSIS — K5090 Crohn's disease, unspecified, without complications: Secondary | ICD-10-CM

## 2018-05-23 MED ORDER — DIPHENHYDRAMINE HCL 25 MG PO CAPS
50.0000 mg | ORAL_CAPSULE | Freq: Once | ORAL | Status: AC
  Administered 2018-05-23: 50 mg via ORAL

## 2018-05-23 MED ORDER — DIPHENHYDRAMINE HCL 25 MG PO CAPS
ORAL_CAPSULE | ORAL | Status: AC
  Filled 2018-05-23: qty 2

## 2018-05-23 MED ORDER — ACETAMINOPHEN 325 MG PO TABS
650.0000 mg | ORAL_TABLET | Freq: Once | ORAL | Status: AC
  Administered 2018-05-23: 650 mg via ORAL

## 2018-05-23 MED ORDER — ACETAMINOPHEN 325 MG PO TABS
ORAL_TABLET | ORAL | Status: AC
  Filled 2018-05-23: qty 2

## 2018-05-23 MED ORDER — SODIUM CHLORIDE 0.9 % IV SOLN
10.0000 mg/kg | Freq: Once | INTRAVENOUS | Status: AC
  Administered 2018-05-23: 900 mg via INTRAVENOUS
  Filled 2018-05-23: qty 90

## 2018-06-13 ENCOUNTER — Ambulatory Visit: Payer: Medicare Other | Admitting: Gastroenterology

## 2018-06-20 ENCOUNTER — Inpatient Hospital Stay (HOSPITAL_COMMUNITY): Admission: RE | Admit: 2018-06-20 | Payer: Medicare Other | Source: Ambulatory Visit

## 2018-06-25 ENCOUNTER — Other Ambulatory Visit: Payer: Self-pay

## 2018-06-25 ENCOUNTER — Telehealth: Payer: Self-pay | Admitting: Gastroenterology

## 2018-06-25 DIAGNOSIS — K5090 Crohn's disease, unspecified, without complications: Secondary | ICD-10-CM

## 2018-06-25 NOTE — Telephone Encounter (Signed)
Orders in epic.

## 2018-06-26 ENCOUNTER — Inpatient Hospital Stay (HOSPITAL_COMMUNITY): Admission: RE | Admit: 2018-06-26 | Payer: Medicare Other | Source: Ambulatory Visit

## 2018-07-03 ENCOUNTER — Ambulatory Visit: Payer: Medicare Other | Admitting: Gastroenterology

## 2018-07-04 ENCOUNTER — Ambulatory Visit (HOSPITAL_COMMUNITY)
Admission: RE | Admit: 2018-07-04 | Discharge: 2018-07-04 | Disposition: A | Discharge status: Home / Self Care | Payer: Medicare Other | Source: Ambulatory Visit | Attending: Gastroenterology | Admitting: Gastroenterology

## 2018-07-04 DIAGNOSIS — K5090 Crohn's disease, unspecified, without complications: Secondary | ICD-10-CM | POA: Diagnosis not present

## 2018-07-04 MED ORDER — DIPHENHYDRAMINE HCL 25 MG PO CAPS
50.0000 mg | ORAL_CAPSULE | Freq: Once | ORAL | Status: DC
  Administered 2018-07-04: 50 mg via ORAL

## 2018-07-04 MED ORDER — DIPHENHYDRAMINE HCL 25 MG PO CAPS
ORAL_CAPSULE | ORAL | Status: AC
  Filled 2018-07-04: qty 2

## 2018-07-04 MED ORDER — ACETAMINOPHEN 325 MG PO TABS
ORAL_TABLET | ORAL | Status: AC
  Filled 2018-07-04: qty 2

## 2018-07-04 MED ORDER — SODIUM CHLORIDE 0.9 % IV SOLN
10.0000 mg/kg | INTRAVENOUS | Status: DC
  Administered 2018-07-04: 900 mg via INTRAVENOUS
  Filled 2018-07-04: qty 90

## 2018-07-04 MED ORDER — ACETAMINOPHEN 325 MG PO TABS
650.0000 mg | ORAL_TABLET | Freq: Once | ORAL | Status: DC
  Administered 2018-07-04: 650 mg via ORAL

## 2018-07-16 ENCOUNTER — Ambulatory Visit: Payer: Medicare Other | Admitting: Gastroenterology

## 2018-07-16 NOTE — Progress Notes (Deleted)
     [facility] Progress Note  Chief Complaint: ***  Subjective  History:  *** He rescheduled his January 2 appointment, stating that his Remicade infusion would not be done on time that day.  He then canceled his January 23 appointment same day. ROS: Cardiovascular:  no chest pain Respiratory: no dyspnea  The patient's Past Medical, Family and Social History were reviewed and are on file in the EMR.  Objective:  Med list reviewed  Current Outpatient Medications:  .  amLODipine (NORVASC) 2.5 MG tablet, Take 1 tablet (2.5 mg total) by mouth daily., Disp: 90 tablet, Rfl: 3 .  cyanocobalamin (,VITAMIN B-12,) 1000 MCG/ML injection, Inject 1 mL (1,000 mcg total) into the muscle every 30 (thirty) days., Disp: 10 mL, Rfl: 0 .  ferrous sulfate (FERROUSUL) 325 (65 FE) MG tablet, Take 1 tablet (325 mg total) by mouth 2 (two) times daily with a meal., Disp: 90 tablet, Rfl: 0 .  inFLIXimab 900 mg in sodium chloride 0.9 % 160 mL, Inject 900 mg into the vein every 30 (thirty) days., Disp: , Rfl:    Vital signs in last 24 hrs: There were no vitals filed for this visit.  Physical Exam  ***  HEENT: sclera anicteric, oral mucosa moist without lesions  Neck: supple, no thyromegaly, JVD or lymphadenopathy  Cardiac: RRR without murmurs, S1S2 heard, no peripheral edema  Pulm: clear to auscultation bilaterally, normal RR and effort noted  Abdomen: soft, *** tenderness, with active bowel sounds. No guarding or palpable hepatosplenomegaly.  Skin; warm and dry, no jaundice or rash  Recent Labs:  CBC Latest Ref Rng & Units 03/20/2018 01/04/2018 12/14/2017  WBC 4.0 - 10.5 K/uL 4.3 5.9 6.1  Hemoglobin 13.0 - 17.0 g/dL 12.8(L) 12.2(L) 11.9(L)  Hematocrit 39.0 - 52.0 % 38.2(L) 37.7(L) 35.7(L)  Platelets 150.0 - 400.0 K/uL 233.0 313.0 310.0   CMP Latest Ref Rng & Units 08/20/2017 07/05/2016 04/19/2016  Glucose 70 - 99 mg/dL 102(H) 86 82  BUN 6 - 23 mg/dL _0 Creatinine 0.40 - 1.50  mg/dL 1.42 1.61(H) 1.78(H)  Sodium 135 - 145 mEq/L 136 139 138  Potassium 3.5 - 5.1 mEq/L 3.8 4.3 4.1  Chloride 96 - 112 mEq/L 106 114(H) 108  CO2 19 - 32 mEq/L _1 Calcium 8.4 - 10.5 mg/dL 9.0 9.2 9.5  Total Protein 6.0 - 8.3 g/dL 7.9 7.7 8.2  Total Bilirubin 0.2 - 1.2 mg/dL 0.4 0.4 0.4  Alkaline Phos 39 - 117 U/L 75 70 69  AST 0 - 37 U/L _2 ALT 0 - 53 U/L _3 ESR was 28 in October 2019, down from 66 in August and 118 in July.  Radiologic studies:    _4 @ Assessment: No diagnosis found.    Plan:    Total time *** minutes, over half spent face-to-face with patient in counseling and coordination of care.   Nelida Meuse III

## 2018-07-18 ENCOUNTER — Encounter (HOSPITAL_COMMUNITY): Payer: Medicare Other

## 2018-07-19 ENCOUNTER — Telehealth: Payer: Self-pay | Admitting: Gastroenterology

## 2018-07-19 NOTE — Telephone Encounter (Signed)
This patient had another no-show for clinic earlier this week.  He has been noncompliant with follow-up, with documentation of frequent no-shows or same-day cancellations since starting care with [facility].  Please prepare a letter to discharge him from [place].  I am willing to continue his care up until, but not past, is currently scheduled Remicade infusion on April 9.  This should give him plenty of time to establish care with a new GI provider in the area.

## 2018-07-22 NOTE — Telephone Encounter (Signed)
Letter and discharge form placed in the your office

## 2018-07-24 ENCOUNTER — Telehealth: Payer: Self-pay | Admitting: Gastroenterology

## 2018-07-24 NOTE — Telephone Encounter (Signed)
Patient dismissed from [facility] by Dr. Wilfrid Lund, effective 07/22/18. Dismissal letter was sent by registered mail. js.

## 2018-08-21 ENCOUNTER — Encounter (HOSPITAL_COMMUNITY): Payer: Medicare Other

## 2018-08-29 ENCOUNTER — Encounter (HOSPITAL_COMMUNITY)
Admission: RE | Admit: 2018-08-29 | Discharge: 2018-08-29 | Disposition: A | Discharge status: Home / Self Care | Payer: Medicare Other | Source: Ambulatory Visit | Attending: Gastroenterology | Admitting: Gastroenterology

## 2018-08-29 ENCOUNTER — Other Ambulatory Visit: Payer: Self-pay

## 2018-08-29 DIAGNOSIS — K5090 Crohn's disease, unspecified, without complications: Secondary | ICD-10-CM | POA: Insufficient documentation

## 2018-08-29 MED ORDER — SODIUM CHLORIDE 0.9 % IV SOLN
10.0000 mg/kg | INTRAVENOUS | Status: DC
  Administered 2018-08-29: 900 mg via INTRAVENOUS
  Filled 2018-08-29: qty 90

## 2018-08-29 MED ORDER — ACETAMINOPHEN 325 MG PO TABS
ORAL_TABLET | ORAL | Status: AC
  Administered 2018-08-29: 650 mg via ORAL
  Filled 2018-08-29: qty 2

## 2018-08-29 MED ORDER — ACETAMINOPHEN 325 MG PO TABS
650.0000 mg | ORAL_TABLET | Freq: Once | ORAL | Status: AC
  Administered 2018-08-29: 650 mg via ORAL

## 2018-08-29 MED ORDER — DIPHENHYDRAMINE HCL 25 MG PO CAPS
50.0000 mg | ORAL_CAPSULE | Freq: Once | ORAL | Status: AC
  Administered 2018-08-29: 50 mg via ORAL

## 2018-08-29 MED ORDER — DIPHENHYDRAMINE HCL 25 MG PO CAPS
ORAL_CAPSULE | ORAL | Status: AC
  Administered 2018-08-29: 50 mg via ORAL
  Filled 2018-08-29: qty 2

## 2018-10-23 ENCOUNTER — Other Ambulatory Visit (HOSPITAL_COMMUNITY): Payer: Self-pay | Admitting: *Deleted

## 2018-10-24 ENCOUNTER — Inpatient Hospital Stay (HOSPITAL_COMMUNITY): Admission: RE | Admit: 2018-10-24 | Payer: Medicare Other | Source: Ambulatory Visit

## 2018-10-29 IMAGING — CT CT ABD-PELV W/ CM
2 of 5 series · 15 of 46 positions shown, 17 images · IV contrast (ISOVUE 300)
Comparison: None available. Reports from previous study 12/16/2001
and 01/27/2002.

CLINICAL DATA: Abdominal pain for 1 week. History of Crohn's
colitis. Previous partial colon resection.

EXAM:
CT ABDOMEN AND PELVIS WITH CONTRAST
TECHNIQUE: Multidetector CT imaging of the abdomen and pelvis was performed
using the standard protocol following bolus administration of
intravenous contrast.
CONTRAST:  100mL FWYY53-0MM IOPAMIDOL (FWYY53-0MM) INJECTION 61%

[Series 2: abd/pel w · axial · 0.77mm/px · z∈[-506,-86]mm · 12 of 94 slices shown, 14 images]
[im 5/94  soft-tissue]
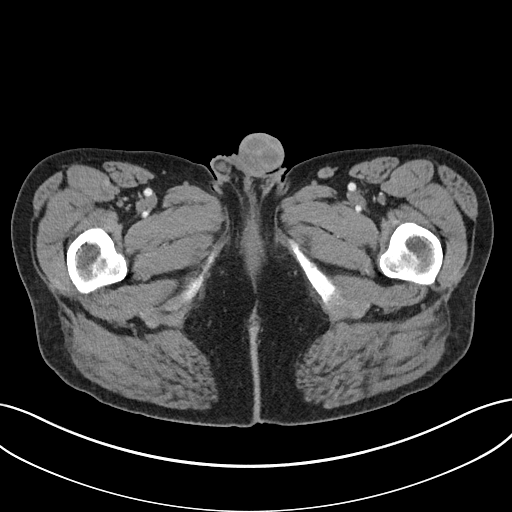
[im 5/94  bone]
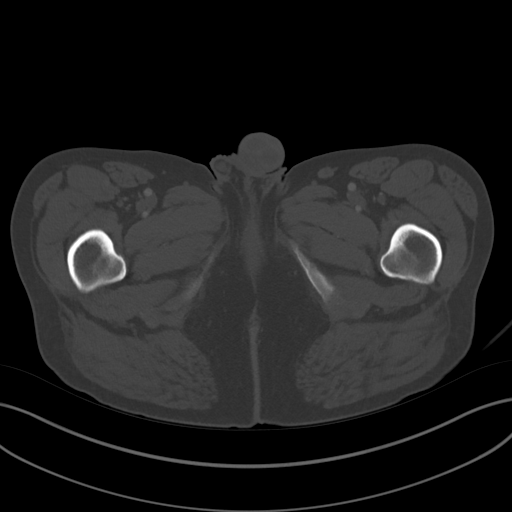
[im 14/94  soft-tissue]
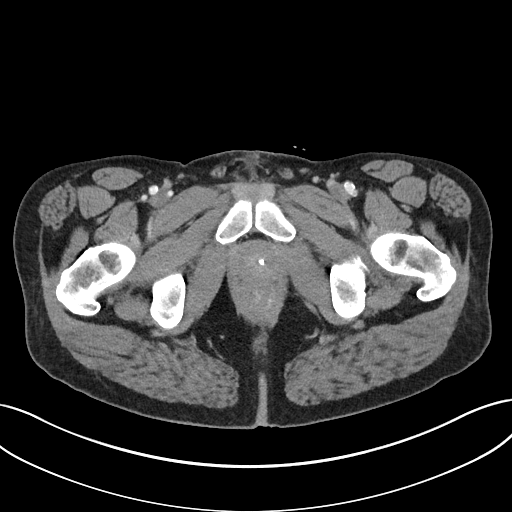
[im 19/94  soft-tissue]
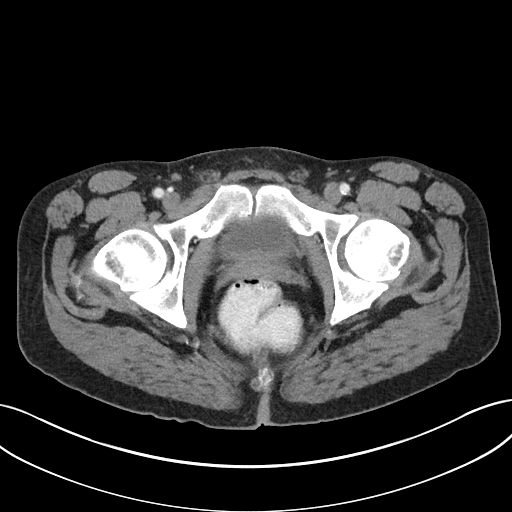
[im 28/94  soft-tissue]
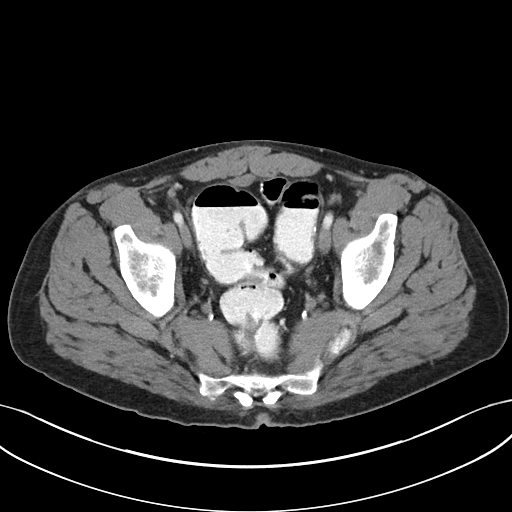
[im 38/94  soft-tissue]
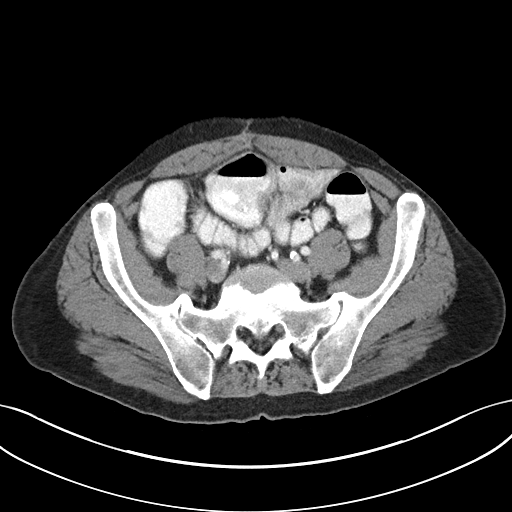
[im 42/94  soft-tissue]
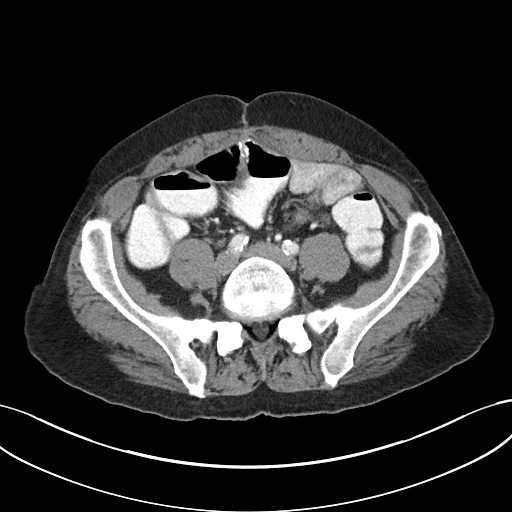
[im 52/94  soft-tissue]
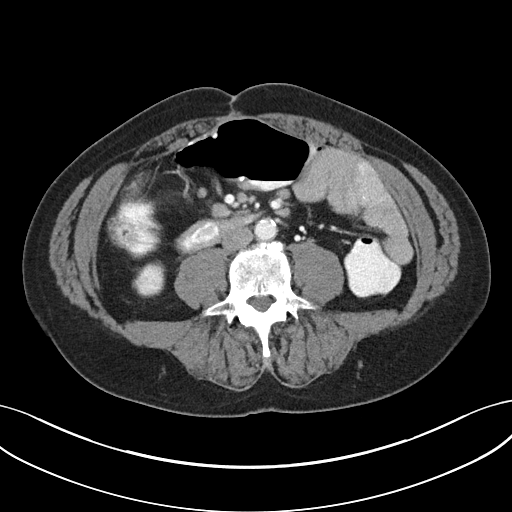
[im 56/94  soft-tissue]
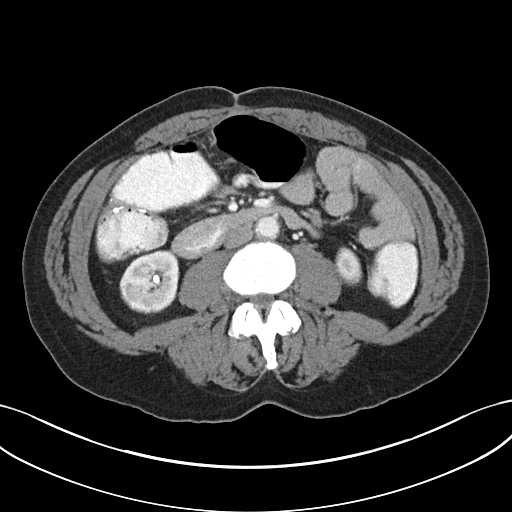
[im 66/94  soft-tissue]
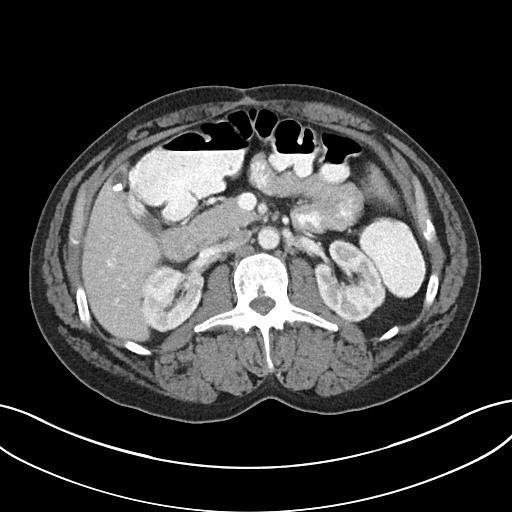
[im 66/94  bone]
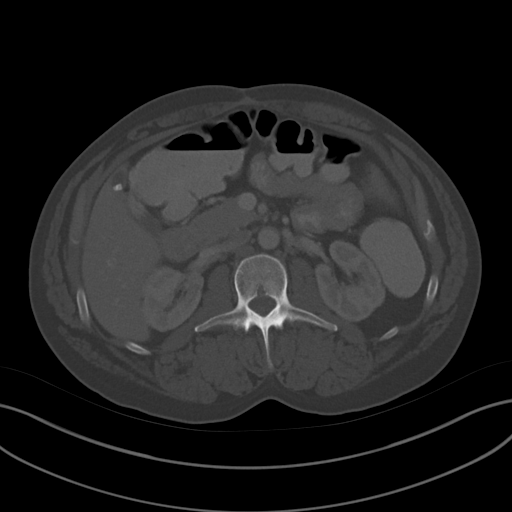
[im 75/94  soft-tissue]
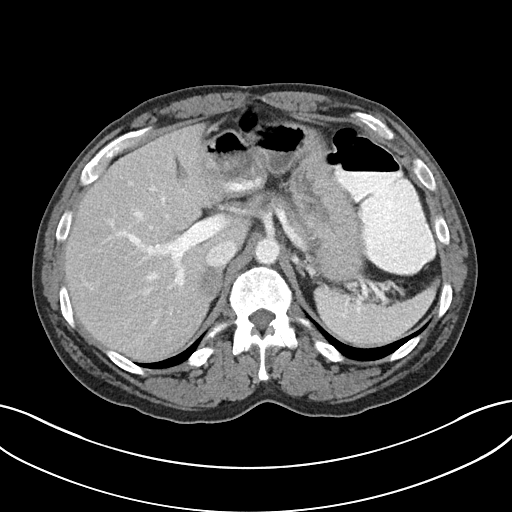
[im 80/94  soft-tissue]
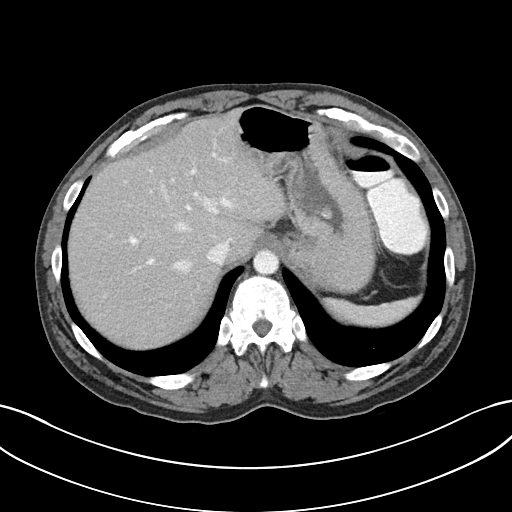
[im 89/94  soft-tissue]
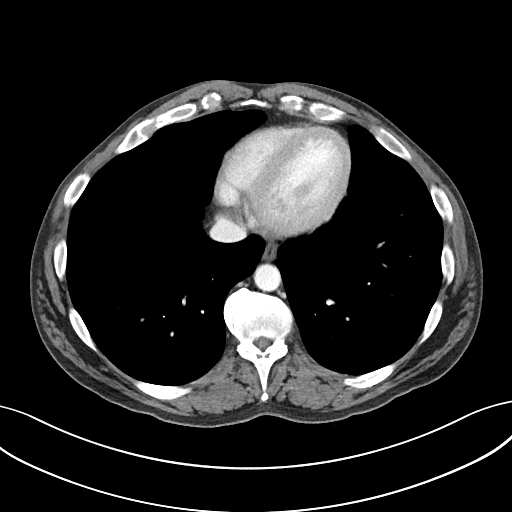

[Series 5: abd/pel w st · coronal · 0.74mm/px · 3 of 86 slices shown]
[im 29/86  soft-tissue]
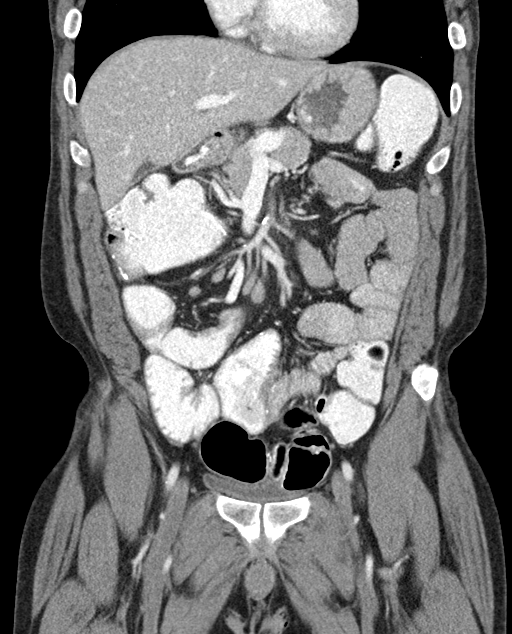
[im 38/86  soft-tissue]
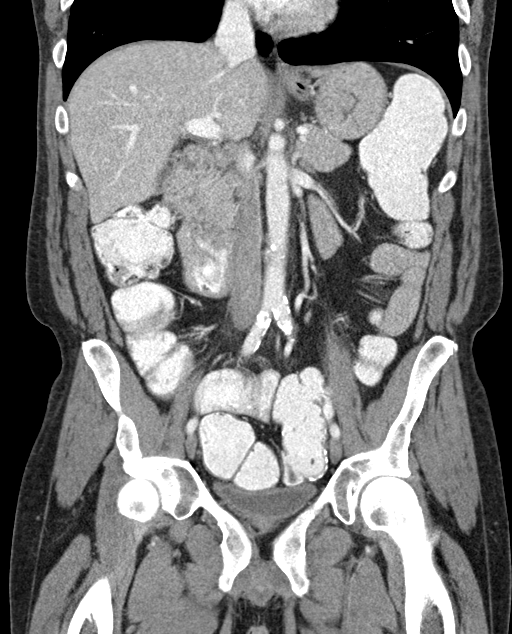
[im 48/86  soft-tissue]
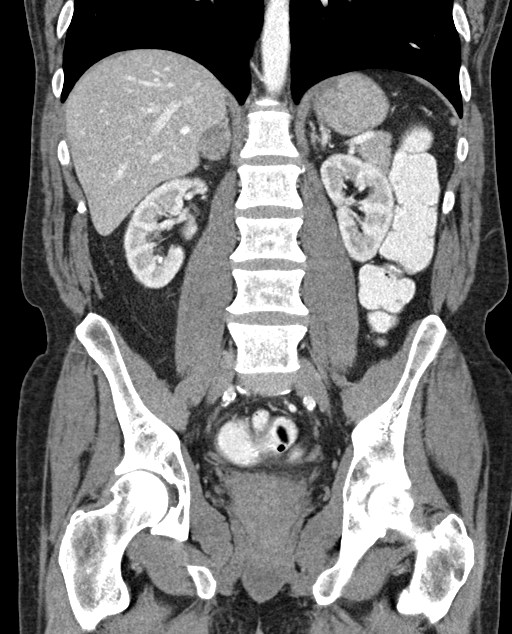

[15 of 46 positions shown; findings below may reference images not displayed]

FINDINGS: Lower chest: Clear lung bases. No significant pleural or pericardial
effusion.

Hepatobiliary: The liver is normal in density without focal
abnormality. No evidence of gallstones, gallbladder wall thickening
or biliary dilatation.

Pancreas: Unremarkable. No pancreatic ductal dilatation or
surrounding inflammatory changes.

Spleen: Normal in size without focal abnormality.

Adrenals/Urinary Tract: Right adrenal mass measures 3.6 x 2.3 cm. It
demonstrates a density of 82 HU on the immediate postcontrast images
and 53 HU on the delayed images. Relative washout of 35% is
indeterminate, although this lesion was previously reported and is
probably an incidental adenoma. The left adrenal gland appears
normal. There is a nonobstructing calculus measuring 7 mm in the
lower pole of the right kidney (image 42/2). No other urinary tract
calculi demonstrated. There is no evidence of hydronephrosis or
renal mass. The bladder appears normal.

Stomach/Bowel: Proximal gastric wall thickening versus incomplete
distention. Status post distal small bowel resection, right
colectomy and ileocolonic anastomosis. There is luminal narrowing of
the extreme terminal ileum, best seen on coronal images 21 through
26. No associated wall thickening or surrounding inflammation. The
small bowel proximal to this is mildly dilated. No colonic wall
thickening or surrounding inflammation. No extraluminal fluid
collection or definite fistula.

Vascular/Lymphatic: Several prominent lymph nodes within the
mesentery are likely reactive. No retroperitoneal lymphadenopathy.
Aortic and branch vessel atherosclerosis. No acute vascular
findings.

Reproductive: The prostate gland and seminal vesicles appear normal.

Other: Postsurgical changes in the anterior abdominal wall. No
hernia or ascites.

Musculoskeletal: No acute osseous findings. The sacroiliac joints
appear partially ankylosed bilaterally.
IMPRESSION: 1. Postsurgical changes consistent with previous right colectomy and
distal small bowel resection. There is narrowing of the small bowel
lumen proximal to the anastomosis without apparent wall thickening
or surrounding inflammation. This could reflect a distal small bowel
stricture.
2. No definite signs of active Crohn's disease.
3. Proximal gastric wall thickening versus incomplete distention.
4. Right adrenal nodule, described on previous studies in 1887,
probably an incidental adenoma.
5. Nonobstructing right renal calculus.
6.  Aortic Atherosclerosis ([id number]-[id number].D).

## 2018-11-01 ENCOUNTER — Other Ambulatory Visit: Payer: Self-pay

## 2018-11-01 ENCOUNTER — Ambulatory Visit (HOSPITAL_COMMUNITY)
Admission: RE | Admit: 2018-11-01 | Discharge: 2018-11-01 | Disposition: A | Discharge status: Home / Self Care | Payer: Medicare Other | Source: Ambulatory Visit | Attending: Gastroenterology | Admitting: Gastroenterology

## 2018-11-01 DIAGNOSIS — K5090 Crohn's disease, unspecified, without complications: Secondary | ICD-10-CM | POA: Diagnosis not present

## 2018-11-01 MED ORDER — DIPHENHYDRAMINE HCL 25 MG PO CAPS
ORAL_CAPSULE | ORAL | Status: AC
  Filled 2018-11-01: qty 2

## 2018-11-01 MED ORDER — SODIUM CHLORIDE 0.9 % IV SOLN
10.0000 mg/kg | INTRAVENOUS | Status: DC
  Administered 2018-11-01: 900 mg via INTRAVENOUS
  Filled 2018-11-01: qty 90

## 2018-11-01 MED ORDER — DIPHENHYDRAMINE HCL 25 MG PO CAPS
50.0000 mg | ORAL_CAPSULE | ORAL | Status: DC
  Administered 2018-11-01: 50 mg via ORAL

## 2018-11-01 MED ORDER — ACETAMINOPHEN 325 MG PO TABS
ORAL_TABLET | ORAL | Status: AC
  Filled 2018-11-01: qty 2

## 2018-11-01 MED ORDER — ACETAMINOPHEN 325 MG PO TABS
650.0000 mg | ORAL_TABLET | ORAL | Status: DC
  Administered 2018-11-01: 650 mg via ORAL

## 2018-11-08 ENCOUNTER — Telehealth: Payer: Self-pay | Admitting: Gastroenterology

## 2018-11-08 NOTE — Telephone Encounter (Signed)
I rec'd a coding inquiry from [facility] day [facility] regarding this patient and a recent remicade treatment.  Please see phone notes from March where this patient was discharged from [place].  The early April date was to be his last remicade treatment ordered by me.  As such, there should be absolutely NO ongoing orders for medicines on this patient.  Perhaps it was a recurring order that never got discontinued.  Please address with the infusion center.  Unclear how/why patient not aware of being discharged from [facility] with need to establish at another GI practice - he may have chosen not to pick up his registered letter.  Nevertheless, please contact him today to make him aware.

## 2018-11-08 NOTE — Telephone Encounter (Signed)
Patient was contacted and we discussed discharge back in March.  He said he was aware of the dismissal.  I advised him that he will need to seek the care of another GI group and should get working on that soon. I notified him that I have cancelled his Remicade infusions and that he will need to have another GI group arrange any further infusion.

## 2018-11-19 DIAGNOSIS — L255 Unspecified contact dermatitis due to plants, except food: Secondary | ICD-10-CM | POA: Diagnosis not present

## 2018-11-19 DIAGNOSIS — Z202 Contact with and (suspected) exposure to infections with a predominantly sexual mode of transmission: Secondary | ICD-10-CM | POA: Diagnosis not present

## 2018-11-19 DIAGNOSIS — Z20818 Contact with and (suspected) exposure to other bacterial communicable diseases: Secondary | ICD-10-CM | POA: Diagnosis not present

## 2018-12-19 ENCOUNTER — Encounter (HOSPITAL_COMMUNITY): Payer: Medicare Other

## 2018-12-27 ENCOUNTER — Encounter (HOSPITAL_COMMUNITY): Payer: Medicare Other

## 2019-02-05 DIAGNOSIS — Z79899 Other long term (current) drug therapy: Secondary | ICD-10-CM | POA: Diagnosis not present

## 2019-02-05 DIAGNOSIS — Z1159 Encounter for screening for other viral diseases: Secondary | ICD-10-CM | POA: Diagnosis not present

## 2019-02-24 DIAGNOSIS — B353 Tinea pedis: Secondary | ICD-10-CM | POA: Diagnosis not present

## 2019-02-24 DIAGNOSIS — L600 Ingrowing nail: Secondary | ICD-10-CM | POA: Diagnosis not present

## 2019-02-24 DIAGNOSIS — B351 Tinea unguium: Secondary | ICD-10-CM | POA: Diagnosis not present

## 2019-03-03 DIAGNOSIS — Z1211 Encounter for screening for malignant neoplasm of colon: Secondary | ICD-10-CM | POA: Diagnosis not present

## 2019-03-03 DIAGNOSIS — K50019 Crohn's disease of small intestine with unspecified complications: Secondary | ICD-10-CM | POA: Diagnosis not present

## 2019-03-10 DIAGNOSIS — Z79899 Other long term (current) drug therapy: Secondary | ICD-10-CM | POA: Diagnosis not present

## 2019-03-10 DIAGNOSIS — B351 Tinea unguium: Secondary | ICD-10-CM | POA: Diagnosis not present

## 2019-03-10 DIAGNOSIS — B353 Tinea pedis: Secondary | ICD-10-CM | POA: Diagnosis not present

## 2019-03-17 DIAGNOSIS — K5090 Crohn's disease, unspecified, without complications: Secondary | ICD-10-CM | POA: Diagnosis not present

## 2019-03-17 DIAGNOSIS — K5140 Inflammatory polyps of colon without complications: Secondary | ICD-10-CM | POA: Diagnosis not present

## 2019-03-17 DIAGNOSIS — K648 Other hemorrhoids: Secondary | ICD-10-CM | POA: Diagnosis not present

## 2019-03-17 DIAGNOSIS — Z980 Intestinal bypass and anastomosis status: Secondary | ICD-10-CM | POA: Diagnosis not present

## 2019-04-14 DIAGNOSIS — B351 Tinea unguium: Secondary | ICD-10-CM | POA: Diagnosis not present

## 2019-04-14 DIAGNOSIS — Z79899 Other long term (current) drug therapy: Secondary | ICD-10-CM | POA: Diagnosis not present

## 2019-04-14 DIAGNOSIS — B353 Tinea pedis: Secondary | ICD-10-CM | POA: Diagnosis not present

## 2019-06-17 ENCOUNTER — Other Ambulatory Visit (HOSPITAL_COMMUNITY): Payer: Self-pay | Admitting: *Deleted

## 2019-06-18 ENCOUNTER — Encounter (HOSPITAL_COMMUNITY): Payer: Medicare Other

## 2019-06-20 ENCOUNTER — Other Ambulatory Visit: Payer: Self-pay

## 2019-06-20 ENCOUNTER — Ambulatory Visit (HOSPITAL_COMMUNITY)
Admission: RE | Admit: 2019-06-20 | Discharge: 2019-06-20 | Disposition: A | Discharge status: Home / Self Care | Payer: Medicare Other | Source: Ambulatory Visit | Attending: Internal Medicine | Admitting: Internal Medicine

## 2019-06-20 DIAGNOSIS — D649 Anemia, unspecified: Secondary | ICD-10-CM | POA: Insufficient documentation

## 2019-06-20 LAB — ABO/RH: ABO/RH(D): A POS

## 2019-06-20 LAB — PREPARE RBC (CROSSMATCH)

## 2019-06-20 MED ORDER — SODIUM CHLORIDE 0.9% IV SOLUTION: Freq: Once | INTRAVENOUS | Status: AC

## 2019-06-20 NOTE — Discharge Instructions (Signed)
Blood Transfusion, Adult, Care After This sheet gives you information about how to care for yourself after your procedure. Your doctor may also give you more specific instructions. If you have problems or questions, contact your doctor. What can I expect after the procedure? After the procedure, it is common to have:  Bruising and soreness at the IV site.  A fever or chills on the day of the procedure. This may be your body's response to the new blood cells received.  A headache. Follow these instructions at home: Insertion site care      Follow instructions from your doctor about how to take care of your insertion site. This is where an IV tube was put into your vein. Make sure you: ? Wash your hands with soap and water before and after you change your bandage (dressing). If you cannot use soap and water, use hand sanitizer. ? Change your bandage as told by your doctor.  Check your insertion site every day for signs of infection. Check for: ? Redness, swelling, or pain. ? Bleeding from the site. ? Warmth. ? Pus or a bad smell. General instructions  Take over-the-counter and prescription medicines only as told by your doctor.  Rest as told by your doctor.  Go back to your normal activities as told by your doctor.  Keep all follow-up visits as told by your doctor. This is important. Contact a doctor if:  You have itching or red, swollen areas of skin (hives).  You feel worried or nervous (anxious).  You feel weak after doing your normal activities.  You have redness, swelling, warmth, or pain around the insertion site.  You have blood coming from the insertion site, and the blood does not stop with pressure.  You have pus or a bad smell coming from the insertion site. Get help right away if:  You have signs of a serious reaction. This may be coming from an allergy or the body's defense system (immune system). Signs include: ? Trouble breathing or shortness of  breath. ? Swelling of the face or feeling warm (flushed). ? Fever or chills. ? Head, chest, or back pain. ? Dark pee (urine) or blood in the pee. ? Widespread rash. ? Fast heartbeat. ? Feeling dizzy or light-headed. You may receive your blood transfusion in an outpatient setting. If so, you will be told whom to contact to report any reactions. These symptoms may be an emergency. Do not wait to see if the symptoms will go away. Get medical help right away. Call your local emergency services (911 in the [place] Do not drive yourself to the hospital. Summary  Bruising and soreness at the IV site are common.  Check your insertion site every day for signs of infection.  Rest as told by your doctor. Go back to your normal activities as told by your doctor.  Get help right away if you have signs of a serious reaction. This information is not intended to replace advice given to you by your health care provider. Make sure you discuss any questions you have with your health care provider. Document Revised: 10/31/2018 Document Reviewed: 10/31/2018 Elsevier Patient Education  [place].

## 2019-06-20 NOTE — Progress Notes (Signed)
Patient was transfused with 1 unit of packed red blood cells as ordered by Otis Brace MD via PIV. Type and screen lab was drawn prior to the transfusion. Patient was upset because he said his labs were drawn 06/19/2019. RN checked lab results, there was no type and screen result, moreover patient did not have a blood bank bracelet on. Patient was uncoperative  and angry when the process was explained to him. That the type and screen labs will be drawn, sent to the lab before matching the blood. And that this procedure can take up to 45 minutes or more before transfusion can be initiated. Both RN acknowledged and apologized for the patient's wait and we sought the patient's understanding.Tolerated well, vitals stable, discharge instructions given, verbalized understanding. Patient alert, oriented and ambulatory at the time of discharge.

## 2019-06-21 LAB — TYPE AND SCREEN
ABO/RH(D): A POS
Antibody Screen: NEGATIVE
Unit division: 0

## 2019-06-21 LAB — BPAM RBC
Blood Product Expiration Date: 202102152359
ISSUE DATE / TIME: 202101291046
Unit Type and Rh: 6200

## 2020-06-18 ENCOUNTER — Telehealth: Payer: Self-pay | Admitting: Family Medicine

## 2020-06-18 NOTE — Telephone Encounter (Signed)
Tried calling patient to  schedule Medicare Annual Wellness Visit (AWV) either virtually or in office.  No answer  Last AWV no information  please schedule at anytime with [facility] Nurse Health Advisor 1 or 2   This should be a 45 minute visit. Patient needs appointment with pcp last ov 03/25/18

## 2020-11-30 ENCOUNTER — Telehealth: Payer: Self-pay | Admitting: Family Medicine

## 2020-11-30 NOTE — Telephone Encounter (Signed)
Left message for patient to call back and schedule Medicare Annual Wellness Visit (AWV) either virtually or in office.   AWV-I PER [facility] 07/20/12  please schedule at anytime with [facility] Nurse Health Advisor 1 or 2   This should be a 45 minute visit.

## 2020-12-30 ENCOUNTER — Telehealth: Payer: Self-pay | Admitting: Family Medicine

## 2020-12-30 NOTE — Telephone Encounter (Signed)
Tried calling patient to schedule Medicare Annual Wellness Visit (AWV) either virtually or in office.   No answer  AWV-I PER [facility] 07/20/12  please schedule at anytime with [facility] Nurse Health Advisor 1 or 2   Also need appointment with pcp last appointment 07/10/2018  This should be a 45 minute visit.

## 2021-10-18 ENCOUNTER — Emergency Department (HOSPITAL_COMMUNITY)
Admission: EM | Admit: 2021-10-18 | Discharge: 2021-10-19 | Disposition: A | Discharge status: Home / Self Care | Payer: 59 | Attending: Emergency Medicine | Admitting: Emergency Medicine

## 2021-10-18 ENCOUNTER — Emergency Department (HOSPITAL_COMMUNITY): Payer: 59

## 2021-10-18 DIAGNOSIS — D500 Iron deficiency anemia secondary to blood loss (chronic): Secondary | ICD-10-CM | POA: Diagnosis not present

## 2021-10-18 DIAGNOSIS — D631 Anemia in chronic kidney disease: Secondary | ICD-10-CM | POA: Diagnosis not present

## 2021-10-18 DIAGNOSIS — N183 Chronic kidney disease, stage 3 unspecified: Secondary | ICD-10-CM | POA: Insufficient documentation

## 2021-10-18 DIAGNOSIS — K5090 Crohn's disease, unspecified, without complications: Secondary | ICD-10-CM

## 2021-10-18 DIAGNOSIS — R1084 Generalized abdominal pain: Secondary | ICD-10-CM | POA: Diagnosis present

## 2021-10-18 LAB — CBC
HCT: 25.1 % — ABNORMAL LOW (ref 39.0–52.0)
Hemoglobin: 7.4 g/dL — ABNORMAL LOW (ref 13.0–17.0)
MCH: 30.3 pg (ref 26.0–34.0)
MCHC: 29.5 g/dL — ABNORMAL LOW (ref 30.0–36.0)
MCV: 102.9 fL — ABNORMAL HIGH (ref 80.0–100.0)
Platelets: 287 10*3/uL (ref 150–400)
RBC: 2.44 MIL/uL — ABNORMAL LOW (ref 4.22–5.81)
RDW: 19.3 % — ABNORMAL HIGH (ref 11.5–15.5)
WBC: 7.3 10*3/uL (ref 4.0–10.5)
nRBC: 0 % (ref 0.0–0.2)

## 2021-10-18 LAB — COMPREHENSIVE METABOLIC PANEL
ALT: 15 U/L (ref 0–44)
AST: 23 U/L (ref 15–41)
Albumin: 3 g/dL — ABNORMAL LOW (ref 3.5–5.0)
Alkaline Phosphatase: 51 U/L (ref 38–126)
Anion gap: 10 (ref 5–15)
BUN: 12 mg/dL (ref 6–20)
CO2: 17 mmol/L — ABNORMAL LOW (ref 22–32)
Calcium: 8.6 mg/dL — ABNORMAL LOW (ref 8.9–10.3)
Chloride: 113 mmol/L — ABNORMAL HIGH (ref 98–111)
Creatinine, Ser: 2.29 mg/dL — ABNORMAL HIGH (ref 0.61–1.24)
GFR, Estimated: 33 mL/min — ABNORMAL LOW (ref >60)
Glucose, Bld: 81 mg/dL (ref 70–99)
Potassium: 3.5 mmol/L (ref 3.5–5.1)
Sodium: 140 mmol/L (ref 135–145)
Total Bilirubin: 0.4 mg/dL (ref 0.3–1.2)
Total Protein: 6.3 g/dL — ABNORMAL LOW (ref 6.5–8.1)

## 2021-10-18 LAB — TYPE AND SCREEN
ABO/RH(D): A POS
Antibody Screen: NEGATIVE

## 2021-10-18 LAB — PROTIME-INR
INR: 1.1 (ref 0.8–1.2)
Prothrombin Time: 14 seconds (ref 11.4–15.2)

## 2021-10-18 LAB — LIPASE, BLOOD: Lipase: 64 U/L — ABNORMAL HIGH (ref 11–51)

## 2021-10-18 LAB — POC OCCULT BLOOD, ED: Fecal Occult Bld: POSITIVE — AB

## 2021-10-18 MED ORDER — IOHEXOL 9 MG/ML PO SOLN
500.0000 mL | ORAL | Status: AC
  Administered 2021-10-18: 500 mL via ORAL

## 2021-10-18 MED ORDER — ONDANSETRON 4 MG PO TBDP
4.0000 mg | ORAL_TABLET | Freq: Once | ORAL | Status: AC
  Administered 2021-10-18: 4 mg via ORAL
  Filled 2021-10-18: qty 1

## 2021-10-18 MED ORDER — HYDROMORPHONE HCL 1 MG/ML IJ SOLN: 1.0000 mg | Freq: Once | INTRAMUSCULAR | Status: DC

## 2021-10-18 MED ORDER — METHYLPREDNISOLONE SODIUM SUCC 125 MG IJ SOLR
125.0000 mg | Freq: Once | INTRAMUSCULAR | Status: AC
  Administered 2021-10-19: 125 mg via INTRAVENOUS
  Filled 2021-10-18: qty 2

## 2021-10-18 MED ORDER — HYDROMORPHONE HCL 1 MG/ML IJ SOLN
1.0000 mg | Freq: Once | INTRAMUSCULAR | Status: AC
  Administered 2021-10-18: 1 mg via INTRAVENOUS
  Filled 2021-10-18: qty 1

## 2021-10-18 MED ORDER — PANTOPRAZOLE 80MG IVPB - SIMPLE MED
80.0000 mg | Freq: Once | INTRAVENOUS | Status: DC
Start: 2021-10-18 — End: 2021-10-18
  Filled 2021-10-18: qty 100

## 2021-10-18 MED ORDER — LACTATED RINGERS IV BOLUS
1000.0000 mL | Freq: Once | INTRAVENOUS | Status: AC
  Administered 2021-10-18: 1000 mL via INTRAVENOUS

## 2021-10-18 MED ORDER — HYDROMORPHONE HCL 1 MG/ML IJ SOLN
1.0000 mg | Freq: Once | INTRAMUSCULAR | Status: AC
  Administered 2021-10-19: 1 mg via INTRAVENOUS
  Filled 2021-10-18: qty 1

## 2021-10-18 NOTE — ED Triage Notes (Signed)
Pt here from home via [facility] for abd pain and blood in stool x3 days, pt complaining of everything hurting.

## 2021-10-18 NOTE — ED Provider Triage Note (Signed)
Emergency Medicine Provider Triage Evaluation Note  Julian Garner , a 56 y.o. male  was evaluated in triage.  Pt complains of significant left-sided abdominal pain.  Hx of Crohn's with multiple surgical resections.  Endorses nausea and vomiting over the last couple days, some bloody diarrhea/bowel movements and now feels constipated.  Unsure of fevers.  Was sent from his PCP for a transfusion due to a hemoglobin of between 7-8 per patient.  Denies chest pain or short of breath.  Tried the pain are chronic he was sent home from the hospital with a few weeks ago for abdominal pain as well, but stated that provides little to no relief.  Pain rated 10 out of 10 and worst of his life.  Review of Systems  Positive:  Negative: As above  Physical Exam  BP (!) 163/80 (BP Location: Right Arm)   Pulse 90   Temp 99.2 F (37.3 C) (Oral)   Resp (!) 24   SpO2 100%  Gen:   Awake, appears to be in mild distress, tearful Resp:  Normal effort, CTAB MSK:   Moves extremities without difficulty  Other:  Significant left-sided abdominal pain.  Surgical scars observed on the abdomen.  Afebrile.  Tachypneic.  Medical Decision Making  Medically screening exam initiated at 5:17 PM.  Appropriate orders placed.  Julian Garner was informed that the remainder of the evaluation will be completed by another provider, this initial triage assessment does not replace that evaluation, and the importance of remaining in the ED until their evaluation is complete.  Labs, imaging, zofran, and IM dilaudid ordered   Prince Rome, PA-C 02/18/56 1725

## 2021-10-18 NOTE — ED Notes (Signed)
RN in triage room to ask pt questions. Pt stated "I'm hurting everywhere", RN attempted to ask triage questions. Pt yelled "I'm in pain what does that have to do with anything", RN tried to explain that these questions were necessary for his care. Pt yelled "And I'm telling you I have pain and I dont want to answer your questions, don't fucking talk to me and get the fuck out of my face"

## 2021-10-18 NOTE — ED Provider Notes (Signed)
[facility] Provider Note   CSN: [id number] Arrival date & time: 10/18/21  1624     History {Add pertinent medical, surgical, social history, OB history to HPI:1} Chief Complaint  Patient presents with   Abdominal Pain    Julian Garner is a 56 y.o. male.  HPI      56 year old male with a history of Crohn's disease, hyperlipidemia, anemia, CKD stage III, recent emergency department May 17 for abdominal pain, at which point he was found to have a hemoglobin of 7.4, enlargement of the pancreatic tail with mild pancreatic stranding,r/o mass for which a follow-up MRI abdomen with and without contrast was recommended in 3 to 4 weeks, cholelithiasis, no findings to suggest active inflammatory Crohn's disease on CT who presents with concern for abdominal pain,   Did have MRI thoracic spine WO contrast today with degenerative changes    Black and bloody stools started last night 4 stools since last night Yesterday vomiting, no hematemesis Abdominal pain has been somewhat chronic, pain worsened yesterday, left sided abdomen, runs into back.  Was bright red blood then darker blood, then looked like regular brown stool and last time was dark red, stool black looking because of iron with dark red clumps, just  started last night Vomiting 2 days ago, only when eating No known fever   August was 10.8    Off of blood pressure medication  Past Medical History:  Diagnosis Date   Anemia    Crohn's colitis ([place])    Hyperlipidemia     Home Medications Prior to Admission medications   Medication Sig Start Date End Date Taking? Authorizing Provider  amLODipine (NORVASC) 2.5 MG tablet Take 1 tablet (2.5 mg total) by mouth daily. 03/25/18   Billie Ruddy, MD  cyanocobalamin (,VITAMIN B-12,) 1000 MCG/ML injection Inject 1 mL (1,000 mcg total) into the muscle every 30 (thirty) days. 04/21/16   Doran Stabler, MD  ferrous sulfate (FERROUSUL)  325 (65 FE) MG tablet Take 1 tablet (325 mg total) by mouth 2 (two) times daily with a meal. 12/20/16   Danis, Kirke Corin, MD      Allergies    Patient has no known allergies.    Review of Systems   Review of Systems  Physical Exam Updated Vital Signs BP (!) 163/80 (BP Location: Right Arm)   Pulse 90   Temp 99.2 F (37.3 C) (Oral)   Resp (!) 24   SpO2 100%  Physical Exam  ED Results / Procedures / Treatments   Labs (all labs ordered are listed, but only abnormal results are displayed) Labs Reviewed  LIPASE, BLOOD - Abnormal; Notable for the following components:      Result Value   Lipase 64 (*)    All other components within normal limits  COMPREHENSIVE METABOLIC PANEL - Abnormal; Notable for the following components:   Chloride 113 (*)    CO2 17 (*)    Creatinine, Ser 2.29 (*)    Calcium 8.6 (*)    Total Protein 6.3 (*)    Albumin 3.0 (*)    GFR, Estimated 33 (*)    All other components within normal limits  CBC - Abnormal; Notable for the following components:   RBC 2.44 (*)    Hemoglobin 7.4 (*)    HCT 25.1 (*)    MCV 102.9 (*)    MCHC 29.5 (*)    RDW 19.3 (*)    All other components within  normal limits  URINALYSIS, ROUTINE W REFLEX MICROSCOPIC  PROTIME-INR  TYPE AND SCREEN    EKG None  Radiology No results found.  Procedures Procedures  {Document cardiac monitor, telemetry assessment procedure when appropriate:1}  Medications Ordered in ED Medications  ondansetron (ZOFRAN-ODT) disintegrating tablet 4 mg (has no administration in time range)  HYDROmorphone (DILAUDID) injection 1 mg (has no administration in time range)  lactated ringers bolus 1,000 mL (has no administration in time range)    ED Course/ Medical Decision Making/ A&P                           Medical Decision Making Amount and/or Complexity of Data Reviewed Labs: ordered. Radiology: ordered.  Risk Prescription drug management.   ***  {Document critical care time when  appropriate:1} {Document review of labs and clinical decision tools ie heart score, Chads2Vasc2 etc:1}  {Document your independent review of radiology images, and any outside records:1} {Document your discussion with family members, caretakers, and with consultants:1} {Document social determinants of health affecting pt's care:1} {Document your decision making why or why not admission, treatments were needed:1} Final Clinical Impression(s) / ED Diagnoses Final diagnoses:  None    Rx / DC Orders ED Discharge Orders     None

## 2021-10-19 DIAGNOSIS — R1084 Generalized abdominal pain: Secondary | ICD-10-CM | POA: Diagnosis not present

## 2021-10-19 DIAGNOSIS — K5090 Crohn's disease, unspecified, without complications: Secondary | ICD-10-CM

## 2021-10-19 DIAGNOSIS — D500 Iron deficiency anemia secondary to blood loss (chronic): Secondary | ICD-10-CM

## 2021-10-19 MED ORDER — OXYCODONE HCL 5 MG PO TABS: 2.5000 mg | ORAL_TABLET | Freq: Four times a day (QID) | ORAL | 0 refills | Status: AC | PRN

## 2021-10-19 MED ORDER — OXYCODONE HCL 5 MG PO TABS
10.0000 mg | ORAL_TABLET | Freq: Once | ORAL | Status: AC
  Administered 2021-10-19: 10 mg via ORAL
  Filled 2021-10-19: qty 2

## 2021-10-19 MED ORDER — PREDNISONE 20 MG PO TABS: 60.0000 mg | ORAL_TABLET | Freq: Every day | ORAL | 0 refills | Status: AC

## 2021-10-19 NOTE — Assessment & Plan Note (Signed)
Patient's had stable hemoglobin for last 2 weeks.  Hovering around 7.4-7.6.  He is currently not having any bloody stools now.  He is Hemoccult was positive but not surprising given his current active Crohn's flare.  Patient does have a prescription for oral iron but probably should not be taking it at this point with his Crohn's flare.  Patient may need IV iron infusions.

## 2021-10-19 NOTE — Assessment & Plan Note (Addendum)
Patient having typical exacerbation of his Crohn's disease with bloody diarrhea, abdominal pain.  Patient would been on 60 mg of prednisone ordered by his PCP for 7 days.  Patient states that about the third day he was on prednisone he was actually feeling fairly good.  After his seventh dose of prednisone, his abdominal pain had nearly resolved.  About 2 days after he stopped his prednisone, abdominal pain came back.  Patient is due to see his primary gastroenterologist at [facility] Dr. Koleen Distance on October 20, 2021 for his routine appointment.  Will order 1 dose of IV Solu-Medrol and 1 dose of IV Dilaudid.  Ideally the patient would get to his primary GI appointment tomorrow so he can see his gastroenterologist to decide how best to manage his Crohn's flare and further care.  Patient states that during the last week before his next Stelara shot, he starts having some mild GI symptoms.  He states that Delsa Grana has been pretty successful in controlling his symptoms for the last 5 months.  Given his recent flare, either his dosing or the frequency of his injections will need to be reevaluated.  Patient's been followed by Dr. Koleen Distance for many years now.  I stressed to patient and his mother that the best management of his Crohn's disease is with his GI provider who has been seeing him for many years now.  They know how his disease response to different agents.  Going through his chart, the patient is ready been on Remicade, Entyvio, Imuran and now Stelara.  Unclear if he has been on Humira in the past.  His GI doctor would know this.  Moving forward, we will give him 125 mg of Solu-Medrol and 1 dose of IV Dilaudid here in the ER.  Will reassess an hour.  Likely he will go home with some oxycodone 5 mg every 6 hours as needed for pain for the next couple days and 60 mg of prednisone daily until he is seen in the GI clinic.  We will give him a prescription for 14 days of prednisone.  This may need to be extended if his  GI doctor feels that it is appropriate to get his Crohn's exacerbation under control.

## 2021-10-19 NOTE — Consult Note (Signed)
Hospitalist consultation History and Physical    Julian Garner OXB:[id number] DOB: 03/20/1966 DOA: 10/18/2021  DOS: the patient was seen and examined on 10/18/2021  PCP: Vincenza Hews, NP   Patient coming from: Home  I have personally briefly reviewed patient's old medical records in [place]  Chief complaint: Abdominal pain History present illness: 56 year old African-American male history of hypertension untreated, chronic blood loss anemia, Crohn's disease since 2010 being treated by Dr. Koleen Distance with [facility].  Patient's had a partial small bowel resection due to his Crohn's.  Patient has been on multiple agents including Remicade, Entyvio, Imuran, and currently Stelara.  Patient states that about 2-1/2 weeks ago he was seen in the ER on 17 May.  He started having abdominal pain.  CT scan that point was negative for any Crohn's exacerbation.  He was sent home.  He states he went to see his PCP the next day.  PCP placed him on 60 mg of prednisone for a week.  He took this for a week.  He was actually feeling pretty good about 3 days into his prednisone dose.  By the seventh day, his abdominal pain had nearly resolved.  However after his prednisone stopped, 2 days later he started having abdominal pain again.  Patient presented to [facility], ER this time with abdominal pain.  When he was seen in the ER 2 weeks ago, hemoglobin was 7.4.  On 10/14/2021, hemoglobin was 7.6.  Today's hemoglobin is 7.4 again.  White count 7.3.  CT abdomen pelvis showed no bowel wall thickening inflammatory changes to suggest active Crohn's flare.  There was some mild masslike appearance of the tail the pancreas.  This was also seen on his CT on 10/06/2021 while he was at [facility].  This showed some enlargement in the pancreatic tail with mild pancreatic stranding.  This should be followed up either by his GI doctor or his PCP.  They recommended MRI of the abdomen  with and without contrast.  Due to his continued abdominal pain, [facility] hospitalist contacted for consultation.  After further discussion with his mother, the patient states that he did okay with Remicade.  GI notes from [facility] show that although the patient was feeling better on Remicade he still had endoscopic evidence of continued inflammation.  He was then switched to [facility], then Imuran and then recently in December 2020 to [place].  Patient states the only doses of steroids he has recently received have been from his PCP about a week and a half ago.  Patient states that he still having abdominal pain but he is hungry and wants to eat food that he brought with him.  He is not having any active vomiting, diarrhea.   ED Course: CT abdomen pelvis negative for acute Crohn's flare.  Hemoglobin stable at 7.4.  Review of Systems:  Review of Systems  Constitutional:  Positive for malaise/fatigue.  HENT: Negative.    Eyes: Negative.   Respiratory: Negative.    Cardiovascular: Negative.   Gastrointestinal:  Positive for abdominal pain.       Occasional bloody diarrhea.  None today.  Despite having abdominal pain, patient still hungry and wants to eat.  Genitourinary: Negative.   Musculoskeletal: Negative.   Skin: Negative.   Neurological: Negative.   Endo/Heme/Allergies: Negative.   Psychiatric/Behavioral: Negative.    All other systems reviewed and are negative.  Past Medical History:  Diagnosis Date   Anemia    Crohn's colitis (  [place])    Hyperlipidemia     Past Surgical History:  Procedure Laterality Date   COLOSTOMY     ILEOSTOMY       reports that he has been smoking cigarettes. He has been smoking an average of 0.50 packs per day. He has never used smokeless tobacco. He reports current alcohol use of about 3.0 - 4.0 standard drinks per week. He reports that he does not use drugs.  No Known Allergies  Family History  Problem Relation Age of Onset   Healthy Mother     Diabetes Father    Hypertension Maternal Grandmother    Colon cancer Neg Hx    Stomach cancer Neg Hx    Rectal cancer Neg Hx    Esophageal cancer Neg Hx    Liver cancer Neg Hx     Prior to Admission medications   Medication Sig Start Date End Date Taking? Authorizing Provider  ergocalciferol (VITAMIN D2) 1.25 MG (50000 UT) capsule Take 50,000 Units by mouth once a week. No set day   Yes [provider]  ferrous sulfate 325 (65 FE) MG tablet Take 325 mg by mouth once a week. No set day   Yes [provider]  ibuprofen (ADVIL) 200 MG tablet Take 800 mg by mouth every 6 (six) hours as needed for headache or moderate pain.   Yes [provider]  vitamin B-12 (CYANOCOBALAMIN) 1000 MCG tablet Take 1,000 mcg by mouth daily.   Yes [provider]  vitamin C (ASCORBIC ACID) 500 MG tablet Take 500 mg by mouth daily.   Yes [provider]  amLODipine (NORVASC) 2.5 MG tablet Take 1 tablet (2.5 mg total) by mouth daily. Patient not taking: Reported on 10/19/2021 03/25/18   Billie Ruddy, MD  cyanocobalamin (,VITAMIN B-12,) 1000 MCG/ML injection Inject 1 mL (1,000 mcg total) into the muscle every 30 (thirty) days. Patient not taking: Reported on 10/19/2021 04/21/16   Doran Stabler, MD  ferrous sulfate (FERROUSUL) 325 (65 FE) MG tablet Take 1 tablet (325 mg total) by mouth 2 (two) times daily with a meal. Patient not taking: Reported on 10/19/2021 12/20/16   Doran Stabler, MD    Physical Exam: Vitals:   10/18/21 2245 10/18/21 2315 10/19/21 0030 10/19/21 0045  BP:  (!) 160/80 (!) 174/92 (!) 173/83  Pulse: (!) 51 66 79 78  Resp: 14 19 17  (!) 22  Temp:      TempSrc:      SpO2: 100% 97% 100% 100%    Physical Exam Vitals and nursing note reviewed.  Constitutional:      General: He is not in acute distress.    Appearance: Normal appearance. He is not ill-appearing, toxic-appearing or diaphoretic.  HENT:     Head: Normocephalic and atraumatic.      Nose: Nose normal. No rhinorrhea.  Eyes:     General: No scleral icterus. Cardiovascular:     Rate and Rhythm: Normal rate and regular rhythm.  Pulmonary:     Effort: Pulmonary effort is normal. No respiratory distress.     Breath sounds: Normal breath sounds. No wheezing or rales.  Abdominal:     General: Bowel sounds are normal.     Palpations: Abdomen is soft.     Tenderness: There is generalized abdominal tenderness. There is no guarding or rebound.  Musculoskeletal:     Right lower leg: No edema.     Left lower leg: No edema.  Skin:  General: Skin is warm and dry.     Capillary Refill: Capillary refill takes less than 2 seconds.  Neurological:     General: No focal deficit present.     Mental Status: He is alert and oriented to person, place, and time.     Labs on Admission: I have personally reviewed following labs and imaging studies  CBC: Recent Labs  Lab 10/18/21 1709  WBC 7.3  HGB 7.4*  HCT 25.1*  MCV 102.9*  PLT 301   Basic Metabolic Panel: Recent Labs  Lab 10/18/21 1709  NA 140  K 3.5  CL 113*  CO2 17*  GLUCOSE 81  BUN 12  CREATININE 2.29*  CALCIUM 8.6*   GFR: CrCl cannot be calculated (Unknown ideal weight.). Liver Function Tests: Recent Labs  Lab 10/18/21 1709  AST 23  ALT 15  ALKPHOS 51  BILITOT 0.4  PROT 6.3*  ALBUMIN 3.0*   Recent Labs  Lab 10/18/21 1709  LIPASE 64*   No results for input(s): AMMONIA in the last 168 hours. Coagulation Profile: Recent Labs  Lab 10/18/21 1830  INR 1.1   Cardiac Enzymes: No results for input(s): CKTOTAL, CKMB, CKMBINDEX, TROPONINI, TROPONINIHS in the last 168 hours. BNP (last 3 results) No results for input(s): PROBNP in the last 8760 hours. HbA1C: No results for input(s): HGBA1C in the last 72 hours. CBG: No results for input(s): GLUCAP in the last 168 hours. Lipid Profile: No results for input(s): CHOL, HDL, LDLCALC, TRIG, CHOLHDL, LDLDIRECT in the last 72 hours. Thyroid Function  Tests: No results for input(s): TSH, T4TOTAL, FREET4, T3FREE, THYROIDAB in the last 72 hours. Anemia Panel: No results for input(s): VITAMINB12, FOLATE, FERRITIN, TIBC, IRON, RETICCTPCT in the last 72 hours. Urine analysis: No results found for: COLORURINE, APPEARANCEUR, [place], [place], GLUCOSEU, [place], BILIRUBINUR, KETONESUR, PROTEINUR, UROBILINOGEN, NITRITE, LEUKOCYTESUR  Radiological Exams on Admission: I have personally reviewed images CT ABDOMEN PELVIS WO CONTRAST  Result Date: 10/18/2021 CLINICAL DATA:  Abdominal pain, Crohn's disease, multiple prior sections EXAM: CT ABDOMEN AND PELVIS WITHOUT CONTRAST TECHNIQUE: Multidetector CT imaging of the abdomen and pelvis was performed following the standard protocol without IV contrast. RADIATION DOSE REDUCTION: This exam was performed according to the departmental dose-optimization program which includes automated exposure control, adjustment of the mA and/or kV according to patient size and/or use of iterative reconstruction technique. COMPARISON:  Outside hospital [facility] CT abdomen/pelvis dated 10/06/2021 FINDINGS: Lower chest: Small left pleural effusion, new. Associated bilateral lower lobe opacities, likely atelectasis. Hepatobiliary: Unenhanced liver is unremarkable. Small gallstone in the gallbladder fundus (series 3/image 30), without associated inflammatory changes. No intrahepatic or extrahepatic ductal dilatation. Pancreas: Mild peripancreatic stranding along the pancreatic tail (series 3/image 22) with underlying prominence of the pancreatic tail (series 3/image 19). As previously noted, this may reflect sequela of acute pancreatitis. However, given a similar masslike appearance on multiple priors, MRI abdomen with/without contrast is suggested for further evaluation. Spleen: Within normal limits. Adrenals/Urinary Tract: 2.9 cm right adrenal nodule (series 3/image 23), unchanged, favoring a benign adrenal adenoma. Left adrenal gland is  within normal limits. Kidneys are within normal limits.  No hydronephrosis. Bladder is within normal limits. Stomach/Bowel: Stomach is within normal limits. No evidence of bowel obstruction. Status post right hemicolectomy with appendectomy. Additional suture lines in the mid abdomen and left lower abdomen. No bowel wall thickening or inflammatory changes to suggest active inflammatory Crohn's disease. Vascular/Lymphatic: No evidence of abdominal aortic aneurysm. Atherosclerotic calcifications of the abdominal aorta and branch vessels. No  suspicious abdominopelvic lymphadenopathy. Reproductive: Prostate is unremarkable. Other: No abdominopelvic ascites. Musculoskeletal: Degenerative changes of the visualized thoracolumbar spine. IMPRESSION: Stable findings of possible mild acute pancreatitis involving the pancreatic tail. However, given a similar masslike appearance on multiple priors, MRI abdomen with/without contrast is suggested for further evaluation. Status post right hemicolectomy with appendectomy. No evidence of bowel obstruction. No bowel wall thickening or inflammatory changes to suggest active inflammatory Crohn's disease. Small left pleural effusion, new. Associated bilateral lower lobe opacities, likely atelectasis. Electronically Signed   By: Julian Hy M.D.   On: 10/18/2021 22:02    EKG: My personal interpretation of EKG shows: no EKG    Assessment/Plan Principal Problem:   Exacerbation of Crohn's disease ([place]) Active Problems:   Chronic blood loss anemia    Assessment and Plan: * Exacerbation of Crohn's disease ([place]) Patient having typical exacerbation of his Crohn's disease with bloody diarrhea, abdominal pain.  Patient would been on 60 mg of prednisone ordered by his PCP for 7 days.  Patient states that about the third day he was on prednisone he was actually feeling fairly good.  After his seventh dose of prednisone, his abdominal pain had nearly resolved.  About 2 days after  he stopped his prednisone, abdominal pain came back.  Patient is due to see his primary gastroenterologist at [facility] Dr. Koleen Distance on October 20, 2021 for his routine appointment.  Will order 1 dose of IV Solu-Medrol and 1 dose of IV Dilaudid.  Ideally the patient would get to his primary GI appointment tomorrow so he can see his gastroenterologist to decide how best to manage his Crohn's flare and further care.  Patient states that during the last week before his next Stelara shot, he starts having some mild GI symptoms.  He states that Delsa Grana has been pretty successful in controlling his symptoms for the last 5 months.  Given his recent flare, either his dosing or the frequency of his injections will need to be reevaluated.  Patient's been followed by Dr. Koleen Distance for many years now.  I stressed to patient and his mother that the best management of his Crohn's disease is with his GI provider who has been seeing him for many years now.  They know how his disease response to different agents.  Going through his chart, the patient is ready been on Remicade, Entyvio, Imuran and now Stelara.  Unclear if he has been on Humira in the past.  His GI doctor would know this.  Moving forward, we will give him 125 mg of Solu-Medrol and 1 dose of IV Dilaudid here in the ER.  Will reassess an hour.  Likely he will go home with some oxycodone 5 mg every 6 hours as needed for pain for the next couple days and 60 mg of prednisone daily until he is seen in the GI clinic.  We will give him a prescription for 14 days of prednisone.  This may need to be extended if his GI doctor feels that it is appropriate to get his Crohn's exacerbation under control.  Chronic blood loss anemia Patient's had stable hemoglobin for last 2 weeks.  Hovering around 7.4-7.6.  He is currently not having any bloody stools now.  He is Hemoccult was positive but not surprising given his current active Crohn's flare.  Patient does have a prescription for  oral iron but probably should not be taking it at this point with his Crohn's flare.  Patient may need IV iron infusions.  Admission status:  to be determined. Likely discharge to home ,  f/u in 1 hour after IV solumedrol and IV 1 mg dilaudid.   Kristopher Oppenheim, DO [facility] 10/19/2021, 12:52 AM

## 2021-10-19 NOTE — ED Provider Notes (Signed)
I assumed care of this patient.  Please see previous provider note for further details of Hx, PE.  Briefly patient is a 56 y.o. male who presented who presented for abdominal pain.  Patient has a history of Crohn's.  Dr. Billy Fischer consulted hospitalist service to evaluate patient and determine need for admission..  Dr. Bridgett Larsson will evaluate the patient and provide recommendations After evaluation Dr. Bridgett Larsson reported that patient can be discharged home with a prescription for oxycodone and prednisone.  Patient already has a follow-up appointment with GI.   The patient appears reasonably screened and/or stabilized for discharge and I doubt any other medical condition or other [facility] requiring further screening, evaluation, or treatment in the ED at this time prior to discharge. Safe for discharge with strict return precautions.  Disposition: Discharge  Condition: Good  I have discussed the results, Dx and Tx plan with the patient/family who expressed understanding and agree(s) with the plan. Discharge instructions discussed at length. The patient/family was given strict return precautions who verbalized understanding of the instructions. No further questions at time of discharge.    ED Discharge Orders          Ordered    oxyCODONE (ROXICODONE) 5 MG immediate release tablet  Every 6 hours PRN        10/19/21 0240    predniSONE (DELTASONE) 20 MG tablet  Daily        10/19/21 0240             Follow Up: Gastroenterology  Go to  as scheduled        Fatima Blank, MD 10/19/21 [phone]

## 2021-10-19 NOTE — Subjective & Objective (Addendum)
Chief complaint: Abdominal pain History present illness: 56 year old African-American male history of hypertension untreated, chronic blood loss anemia, Crohn's disease since 2010 being treated by Dr. Koleen Distance with [facility].  Patient's had a partial small bowel resection due to his Crohn's.  Patient has been on multiple agents including Remicade, Entyvio, Imuran, and currently Stelara.  Patient states that about 2-1/2 weeks ago he was seen in the ER on 17 May.  He started having abdominal pain.  CT scan that point was negative for any Crohn's exacerbation.  He was sent home.  He states he went to see his PCP the next day.  PCP placed him on 60 mg of prednisone for a week.  He took this for a week.  He was actually feeling pretty good about 3 days into his prednisone dose.  By the seventh day, his abdominal pain had nearly resolved.  However after his prednisone stopped, 2 days later he started having abdominal pain again.  Patient presented to [facility], ER this time with abdominal pain.  When he was seen in the ER 2 weeks ago, hemoglobin was 7.4.  On 10/14/2021, hemoglobin was 7.6.  Today's hemoglobin is 7.4 again.  White count 7.3.  CT abdomen pelvis showed no bowel wall thickening inflammatory changes to suggest active Crohn's flare.  There was some mild masslike appearance of the tail the pancreas.  This was also seen on his CT on 10/06/2021 while he was at [facility].  This showed some enlargement in the pancreatic tail with mild pancreatic stranding.  This should be followed up either by his GI doctor or his PCP.  They recommended MRI of the abdomen with and without contrast.  Due to his continued abdominal pain, [facility] hospitalist contacted for consultation.  After further discussion with his mother, the patient states that he did okay with Remicade.  GI notes from [facility] show that although the patient was feeling better on Remicade he still had endoscopic  evidence of continued inflammation.  He was then switched to [facility], then Imuran and then recently in December 2020 to [place].  Patient states the only doses of steroids he has recently received have been from his PCP about a week and a half ago.  Patient states that he still having abdominal pain but he is hungry and wants to eat food that he brought with him.  He is not having any active vomiting, diarrhea.

## 2021-10-19 NOTE — ED Notes (Signed)
Patient verbalizes understanding of discharge instructions. Opportunity for questioning and answers were provided. Armband removed by staff, pt discharged from ED.  

## 2021-10-19 NOTE — Progress Notes (Signed)
F/u with pt and his mother. Pt has eaten some solid food without N/v. IV dilaudid helped with pain. Has received IV solumedrol. Pt agreeable and comfortable with pain to be discharged from ER with 3 days of oxycodone 5 mg q6h prn and prednisone 60 mg daily for 2 weeks.  Pt to f/u with his GI doctor Dr. Koleen Distance with [facility] tomorrow 10-20-2021.  Pt understands he must not miss his GI appointment tomorrow.
